# Patient Record
Sex: Male | Born: 1975 | Race: White | Hispanic: No | Marital: Married | Smoking: Former smoker
Health system: Southern US, Community
[De-identification: ages and names within clinical notes are randomized; demographics above are authoritative.]

## PROBLEM LIST (undated history)

## (undated) DIAGNOSIS — M25562 Pain in left knee: Secondary | ICD-10-CM

## (undated) DIAGNOSIS — F1110 Opioid abuse, uncomplicated: Secondary | ICD-10-CM

## (undated) DIAGNOSIS — F32A Depression, unspecified: Secondary | ICD-10-CM

## (undated) DIAGNOSIS — F329 Major depressive disorder, single episode, unspecified: Secondary | ICD-10-CM

---

## 2002-01-09 ENCOUNTER — Emergency Department (HOSPITAL_COMMUNITY): Admission: EM | Admit: 2002-01-09 | Discharge: 2002-01-09 | Payer: Self-pay | Admitting: Emergency Medicine

## 2002-07-22 ENCOUNTER — Emergency Department (HOSPITAL_COMMUNITY): Admission: EM | Admit: 2002-07-22 | Discharge: 2002-07-22 | Payer: Self-pay | Admitting: Emergency Medicine

## 2004-03-03 ENCOUNTER — Emergency Department (HOSPITAL_COMMUNITY): Admission: EM | Admit: 2004-03-03 | Discharge: 2004-03-03 | Payer: Self-pay | Admitting: Family Medicine

## 2004-05-05 ENCOUNTER — Emergency Department (HOSPITAL_COMMUNITY): Admission: EM | Admit: 2004-05-05 | Discharge: 2004-05-05 | Payer: Self-pay | Admitting: Family Medicine

## 2006-12-25 ENCOUNTER — Emergency Department (HOSPITAL_COMMUNITY): Admission: EM | Admit: 2006-12-25 | Discharge: 2006-12-25 | Payer: Self-pay | Admitting: Family Medicine

## 2007-01-11 ENCOUNTER — Ambulatory Visit: Payer: Self-pay | Admitting: Family Medicine

## 2007-02-23 ENCOUNTER — Ambulatory Visit: Payer: Self-pay | Admitting: Family Medicine

## 2007-02-23 DIAGNOSIS — E663 Overweight: Secondary | ICD-10-CM | POA: Insufficient documentation

## 2007-03-21 ENCOUNTER — Ambulatory Visit: Payer: Self-pay | Admitting: Family Medicine

## 2007-03-21 ENCOUNTER — Encounter: Payer: Self-pay | Admitting: Family Medicine

## 2007-03-21 LAB — CONVERTED CEMR LAB
ALT: 21 units/L (ref 0–53)
AST: 15 units/L (ref 0–37)
Albumin: 4.5 g/dL (ref 3.5–5.2)
Alkaline Phosphatase: 57 units/L (ref 39–117)
BUN: 9 mg/dL (ref 6–23)
CO2: 22 meq/L (ref 19–32)
Calcium: 9 mg/dL (ref 8.4–10.5)
Chloride: 107 meq/L (ref 96–112)
Cholesterol: 136 mg/dL (ref 0–200)
Creatinine, Ser: 0.84 mg/dL (ref 0.40–1.50)
Glucose, Bld: 97 mg/dL (ref 70–99)
HCT: 45.6 % (ref 39.0–52.0)
HDL: 35 mg/dL — ABNORMAL LOW (ref >39)
Hemoglobin: 15 g/dL (ref 13.0–17.0)
LDL Cholesterol: 81 mg/dL (ref 0–99)
MCHC: 32.9 g/dL (ref 30.0–36.0)
MCV: 89.4 fL (ref 78.0–100.0)
Platelets: 166 10*3/uL (ref 150–400)
Potassium: 3.7 meq/L (ref 3.5–5.3)
RBC: 5.1 M/uL (ref 4.22–5.81)
RDW: 12.9 % (ref 11.5–15.5)
Rapid Strep: NEGATIVE
Sodium: 140 meq/L (ref 135–145)
Total Bilirubin: 0.7 mg/dL (ref 0.3–1.2)
Total CHOL/HDL Ratio: 3.9
Total Protein: 6.3 g/dL (ref 6.0–8.3)
Triglycerides: 102 mg/dL (ref <150)
VLDL: 20 mg/dL (ref 0–40)
WBC: 5.3 10*3/uL (ref 4.0–10.5)

## 2007-08-10 ENCOUNTER — Telehealth (INDEPENDENT_AMBULATORY_CARE_PROVIDER_SITE_OTHER): Payer: Self-pay | Admitting: Family Medicine

## 2007-08-10 ENCOUNTER — Ambulatory Visit: Payer: Self-pay | Admitting: Family Medicine

## 2007-08-10 ENCOUNTER — Encounter: Admission: RE | Admit: 2007-08-10 | Discharge: 2007-08-10 | Payer: Self-pay | Admitting: Family Medicine

## 2007-08-10 DIAGNOSIS — M25569 Pain in unspecified knee: Secondary | ICD-10-CM

## 2007-11-09 ENCOUNTER — Ambulatory Visit: Payer: Self-pay | Admitting: Family Medicine

## 2007-12-20 ENCOUNTER — Telehealth: Payer: Self-pay | Admitting: Family Medicine

## 2008-03-04 ENCOUNTER — Ambulatory Visit: Payer: Self-pay | Admitting: Family Medicine

## 2008-04-16 ENCOUNTER — Encounter: Payer: Self-pay | Admitting: Family Medicine

## 2009-05-04 ENCOUNTER — Emergency Department (HOSPITAL_COMMUNITY): Admission: EM | Admit: 2009-05-04 | Discharge: 2009-05-04 | Payer: Self-pay | Admitting: Family Medicine

## 2009-09-11 ENCOUNTER — Ambulatory Visit: Payer: Self-pay | Admitting: Family Medicine

## 2010-02-07 ENCOUNTER — Emergency Department (HOSPITAL_COMMUNITY)
Admission: EM | Admit: 2010-02-07 | Discharge: 2010-02-07 | Payer: Self-pay | Source: Home / Self Care | Admitting: Family Medicine

## 2010-02-15 LAB — POCT URINALYSIS DIPSTICK
Hgb urine dipstick: NEGATIVE
Ketones, ur: 15 mg/dL — AB
Nitrite: NEGATIVE
Protein, ur: 100 mg/dL — AB
Specific Gravity, Urine: 1.025 (ref 1.005–1.030)
Urine Glucose, Fasting: NEGATIVE mg/dL
Urobilinogen, UA: 1 mg/dL (ref 0.0–1.0)
pH: 6.5 (ref 5.0–8.0)

## 2010-03-02 NOTE — Assessment & Plan Note (Signed)
Summary: cpe/kh   Vital Signs:  Patient profile:   35 year old male Height:      66 inches Weight:      161.6 pounds BMI:     26.18 Temp:     97.7 degrees F oral Pulse rate:   139 / minute BP sitting:   116 / 75  (left arm) Cuff size:   regular  Vitals Entered By: Garen Grams LPN (September 11, 2009 10:52 AM) CC: cpe Is Patient Diabetic? No Pain Assessment Patient in pain? no        Primary Care Webber Michiels:  Renold Don MD  CC:  cpe.  History of Present Illness: Patient here for complete physical exam.  States he has been healthy, no recent illnesses.  No significant past medical history or surgical history.  Only complaint is chronic LEFT knee pain.  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  Managed with Percocet, only course of action per patient is surgery which he wants to put off as long as possible due to cost.    ROS:  no headaches, pre-syncopal or syncopal episodes, chest pain, palpitations, shortness of breath or dyspnea, abdominal pain, diarrhea or constipation, melena, hematochezia, lower extremity swelling.    Habits & Providers  Alcohol-Tobacco-Diet     Tobacco Status: never  Current Problems (verified): 1)  Need Prophylactic Vaccination&inoculation Flu  (ICD-V04.81) 2)  Knee Pain, Left  (ICD-719.46) 3)  Well Adult Exam  (ICD-V70.0) 4)  Overweight  (ICD-278.02)  Current Medications (verified): 1)  Ultram 50 Mg  Tabs (Tramadol Hcl) .... Take One Tablet Every 6 Hours As Needed Pain 2)  Tylenol Extra Strength 500 Mg  Tabs (Acetaminophen) .... Take Two Tablets By Mouth Every 8 Hours As Needed For Pain 3)  Maxifed-G 40-400 Mg  Tabs (Pseudoephedrine-Guaifenesin) .Marland Kitchen.. 1 Tablet Every 6 Hours As Needed. 4)  Claritin 10 Mg  Tabs (Loratadine) .Marland Kitchen.. 1 Tablet A Day As Antihistamine 5)  Voltaren 75 Mg  Tbec (Diclofenac Sodium) .Marland Kitchen.. 1 By Mouth Two Times A Day For 2 Weeks Then Prn 6)  Capsaicin Hp 0.1 %  Crea  (Capsaicin) .... Aaa Three Times A Day As Needed Pain 7)  Flonase 50 Mcg/act Susp (Fluticasone Propionate) .... 2 Sprays in Each Nostril Daily  Allergies (verified): No Known Drug Allergies  Past History:  Past medical, surgical, family and social histories (including risk factors) reviewed, and no changes noted (except as noted below).  Past Medical History: Reviewed history from 03/21/2007 and no changes required. 1.  Arthritis 2.     Past Surgical History: Reviewed history from 03/21/2007 and no changes required. None    Family History: Reviewed history from 03/21/2007 and no changes required. 1. Dad- died MI at 67yo, HTN     Social History: Reviewed history from 03/21/2007 and no changes required. He lives with his girlfriend (Amy [place]), 3 sons Jill Alexanders, Elma [place], [place]) and stepdaughter Leonides Schanz).  Works with Social worker.  No tobacco, 1 beer/month, no drugs.  Physically active with work and at home.    Smoking Status:  never  Physical Exam  General:  Vital signs reviewed. Well-developed, well-nourished patient in NAD.  Awake and cooperative  Head:  normocephalic and atraumatic.   Eyes:  vision grossly intact, pupils equal, pupils round, and pupils reactive to light.   Ears:  External ear exam shows no significant lesions or deformities.  Otoscopic examination reveals clear canals, tympanic membranes are intact bilaterally without bulging, retraction,  inflammation or discharge. Hearing is grossly normal bilaterally. Mouth:  Oral mucosa and oropharynx without lesions or exudates.  Teeth in good repair. Neck:  supple, full ROM, and no masses.   Lungs:  clear to auscultation bilaterally without wheezing, rales, or rhonchi.  Normal work of breathing  Heart:  Regular rate and rhythm without murmur, rub, or gallop.  Normal S1/S2  Abdomen:  soft/nontender/nondistended.  No organomegaly, bowel sounds present.  Msk:  No deformity or scoliosis noted of thoracic or lumbar  spine.   Pulses:  R and L carotid,radial,femoral,dorsalis pedis and posterior tibial pulses are full and equal bilaterally Extremities:  No clubbing, cyanosis, edema, or deformity.  No decrease range of motion noted in any joints.  No point tenderness over LEFT knee.  No swelling noted currently. Neurologic:  No focal deficits Skin:  No suspicious lesions or moles.  Patient does have freckles across neck, forehead, and lower arms, most likely from sun exposure. Psych:  normally interactive, good eye contact, not anxious appearing, and not depressed appearing.     Impression & Recommendations:  Problem # 1:  WELL ADULT EXAM (ICD-V70.0) Assessment Unchanged Patient doing well.  No HTN, DM, never smoked, last lipid check was WNL.  Concern for father with first MI at age 66.  Risk stratification for this patient is low as family history is only concern for CAD.  Otherwise vital signs and physical exam show nothing worrisome.  Plan to follow up as needed.   Orders: [facility] - Est  18-39 yrs (14782)  Problem # 2:  KNEE PAIN, LEFT (ICD-719.46) Assessment: Unchanged Patient prescribed Percocet from orthopedist.  Will let Orthopedist manage this medication.  Currently states knee not bothering him much, flares up only when he has to cover great distance at work, he works outside and puts [employer] so his job requires much walking.   His updated medication list for this problem includes:    Ultram 50 Mg Tabs (Tramadol hcl) .Marland Kitchen... Take one tablet every 6 hours as needed pain    Tylenol Extra Strength 500 Mg Tabs (Acetaminophen) .Marland Kitchen... Take two tablets by mouth every 8 hours as needed for pain    Voltaren 75 Mg Tbec (Diclofenac sodium) .Marland Kitchen... 1 by mouth two times a day for 2 weeks then prn  Complete Medication List: 1)  Ultram 50 Mg Tabs (Tramadol hcl) .... Take one tablet every 6 hours as needed pain 2)  Tylenol Extra Strength 500 Mg Tabs (Acetaminophen) .... Take two tablets by mouth every 8 hours as needed  for pain 3)  Maxifed-g 40-400 Mg Tabs (Pseudoephedrine-guaifenesin) .Marland Kitchen.. 1 tablet every 6 hours as needed. 4)  Claritin 10 Mg Tabs (Loratadine) .Marland Kitchen.. 1 tablet a day as antihistamine 5)  Voltaren 75 Mg Tbec (Diclofenac sodium) .Marland Kitchen.. 1 by mouth two times a day for 2 weeks then prn 6)  Capsaicin Hp 0.1 % Crea (Capsaicin) .... Aaa three times a day as needed pain 7)  Flonase 50 Mcg/act Susp (Fluticasone propionate) .... 2 sprays in each nostril daily

## 2010-11-09 LAB — INFLUENZA A AND B ANTIGEN (CONVERTED LAB)
Inflenza A Ag: NEGATIVE
Influenza B Ag: NEGATIVE

## 2011-02-04 ENCOUNTER — Emergency Department (HOSPITAL_COMMUNITY)
Admission: EM | Admit: 2011-02-04 | Discharge: 2011-02-05 | Disposition: A | Discharge status: Home / Self Care | Payer: Self-pay | Attending: Emergency Medicine | Admitting: Emergency Medicine

## 2011-02-04 ENCOUNTER — Encounter: Payer: Self-pay | Admitting: Emergency Medicine

## 2011-02-04 DIAGNOSIS — K029 Dental caries, unspecified: Secondary | ICD-10-CM | POA: Insufficient documentation

## 2011-02-04 DIAGNOSIS — Z79899 Other long term (current) drug therapy: Secondary | ICD-10-CM | POA: Insufficient documentation

## 2011-02-04 DIAGNOSIS — K089 Disorder of teeth and supporting structures, unspecified: Secondary | ICD-10-CM | POA: Insufficient documentation

## 2011-02-04 NOTE — ED Notes (Signed)
PT. REPORTS RIGHT LOWER MOLAR PAIN FOR SEVERAL DAYS , UNRELIEVED BY OTC PAIN MEDICATIONS

## 2011-02-05 MED ORDER — HYDROCODONE-ACETAMINOPHEN 7.5-500 MG/15ML PO SOLN: 15.0000 mL | Freq: Four times a day (QID) | ORAL | Status: AC | PRN

## 2011-02-05 MED ORDER — TRAMADOL HCL 50 MG PO TABS
50.0000 mg | ORAL_TABLET | Freq: Once | ORAL | Status: AC
  Administered 2011-02-05: 50 mg via ORAL
  Filled 2011-02-05: qty 1

## 2011-02-05 MED ORDER — PENICILLIN V POTASSIUM 500 MG PO TABS: 500.0000 mg | ORAL_TABLET | Freq: Four times a day (QID) | ORAL | Status: AC

## 2011-02-05 MED ORDER — PENICILLIN V POTASSIUM 250 MG PO TABS
500.0000 mg | ORAL_TABLET | Freq: Once | ORAL | Status: AC
  Administered 2011-02-05: 500 mg via ORAL
  Filled 2011-02-05: qty 2

## 2011-02-05 NOTE — ED Provider Notes (Signed)
History     CSN: [id number]  Arrival date & time 02/04/11  2302   First MD Initiated Contact with Patient 02/05/11 [phone]      Chief Complaint  Patient presents with  . Dental Pain    (Consider location/radiation/quality/duration/timing/severity/associated sxs/prior treatment) Patient is a 36 y.o. male presenting with tooth pain. The history is provided by the patient. No language interpreter was used.  Dental PainPrimary symptoms do not include mouth pain, oral bleeding, oral lesions, headaches, fever, sore throat, angioedema or cough. The symptoms began 3 to 5 days ago. The symptoms are worsening. The symptoms are recurrent. The symptoms occur constantly.  Additional symptoms include: dental sensitivity to temperature. Additional symptoms do not include: gum swelling, purulent gums, trismus, facial swelling, trouble swallowing and pain with swallowing. Medical issues do not include: smoking.    History reviewed. No pertinent past medical history.  History reviewed. No pertinent past surgical history.  No family history on file.  History  Substance Use Topics  . Smoking status: Never Smoker   . Smokeless tobacco: Not on file  . Alcohol Use: No      Review of Systems  Constitutional: Negative for fever.  HENT: Negative for sore throat, facial swelling, trouble swallowing and neck pain.   Eyes: Negative.   Respiratory: Negative for cough.   Cardiovascular: Negative.   Gastrointestinal: Negative.   Genitourinary: Negative.   Musculoskeletal: Negative.   Neurological: Negative for headaches.  Hematological: Negative.   Psychiatric/Behavioral: Negative.     Allergies  Ibuprofen  Home Medications   Current Outpatient Rx  Name Route Sig Dispense Refill  . ACETAMINOPHEN 500 MG PO TABS Oral Take 500 mg by mouth every 6 (six) hours as needed. For pain.     Marland Kitchen BENZOCAINE 7.5 % MT GEL Mouth/Throat Use as directed 1 application in the mouth or throat 3 (three) times daily as  needed. For tooth pain.     Marland Kitchen HYDROCODONE-ACETAMINOPHEN 7.5-500 MG/15ML PO SOLN Oral Take 15 mLs by mouth every 6 (six) hours as needed for pain. 120 mL 0  . PENICILLIN V POTASSIUM 500 MG PO TABS Oral Take 1 tablet (500 mg total) by mouth 4 (four) times daily. 28 tablet 0    BP 122/77  Pulse 60  Temp(Src) 98.3 F (36.8 C) (Oral)  Resp 18  SpO2 100%  Physical Exam  Constitutional: He is oriented to person, place, and time. He appears well-developed and well-nourished. No distress.  HENT:  Head: Normocephalic and atraumatic.  Mouth/Throat:    Eyes: Pupils are equal, round, and reactive to light.  Neck: Normal range of motion.  Cardiovascular: Normal rate and regular rhythm.   Pulmonary/Chest: Effort normal and breath sounds normal.  Abdominal: Soft. Bowel sounds are normal.  Musculoskeletal: Normal range of motion.  Lymphadenopathy:    He has no cervical adenopathy.  Neurological: He is alert and oriented to person, place, and time.  Skin: Skin is warm and dry.  Psychiatric: Thought content normal.    ED Course  Procedures (including critical care time)  Labs Reviewed - No data to display No results found.   1. Dental caries       MDM  Follow up with dentistry take all antibiotics       Constancia Geeting K Jorryn Casagrande-Rasch, MD 02/05/11 [phone]

## 2011-08-09 ENCOUNTER — Emergency Department (HOSPITAL_BASED_OUTPATIENT_CLINIC_OR_DEPARTMENT_OTHER)
Admission: EM | Admit: 2011-08-09 | Discharge: 2011-08-09 | Disposition: A | Discharge status: Home / Self Care | Payer: Self-pay | Attending: Emergency Medicine | Admitting: Emergency Medicine

## 2011-08-09 ENCOUNTER — Encounter (HOSPITAL_BASED_OUTPATIENT_CLINIC_OR_DEPARTMENT_OTHER): Payer: Self-pay | Admitting: Emergency Medicine

## 2011-08-09 DIAGNOSIS — Z888 Allergy status to other drugs, medicaments and biological substances status: Secondary | ICD-10-CM | POA: Insufficient documentation

## 2011-08-09 DIAGNOSIS — K0889 Other specified disorders of teeth and supporting structures: Secondary | ICD-10-CM

## 2011-08-09 DIAGNOSIS — K089 Disorder of teeth and supporting structures, unspecified: Secondary | ICD-10-CM | POA: Insufficient documentation

## 2011-08-09 MED ORDER — TRAMADOL HCL 50 MG PO TABS: 50.0000 mg | ORAL_TABLET | Freq: Four times a day (QID) | ORAL | Status: AC | PRN

## 2011-08-09 MED ORDER — PENICILLIN V POTASSIUM 250 MG PO TABS: 250.0000 mg | ORAL_TABLET | Freq: Four times a day (QID) | ORAL | Status: AC

## 2011-08-09 MED ORDER — PENICILLIN V POTASSIUM 250 MG PO TABS
ORAL_TABLET | ORAL | Status: AC
  Administered 2011-08-09: 250 mg
  Filled 2011-08-09: qty 1

## 2011-08-09 MED ORDER — TRAMADOL HCL 50 MG PO TABS
ORAL_TABLET | ORAL | Status: AC
  Administered 2011-08-09: 50 mg
  Filled 2011-08-09: qty 1

## 2011-08-09 NOTE — ED Notes (Signed)
C/o left lower jaw behind wisdom tooth.

## 2011-08-09 NOTE — ED Provider Notes (Signed)
History     CSN: [id number]  Arrival date & time 08/09/11  1650   First MD Initiated Contact with Patient 08/09/11 1714      Chief Complaint  Patient presents with  . Dental Pain    (Consider location/radiation/quality/duration/timing/severity/associated sxs/prior treatment) HPI Comments: C/o left lower dental pain:pt states that he has a history:pt is in drug rehab  Patient is a 36 y.o. male presenting with tooth pain. The history is provided by the patient. No language interpreter was used.  Dental PainThe primary symptoms include mouth pain. The symptoms began 3 to 5 days ago. The symptoms are improving. The symptoms are new. The symptoms occur constantly.  Additional symptoms do not include: gum swelling.    History reviewed. No pertinent past medical history.  History reviewed. No pertinent past surgical history.  No family history on file.  History  Substance Use Topics  . Smoking status: Never Smoker   . Smokeless tobacco: Not on file  . Alcohol Use: No      Review of Systems  Constitutional: Negative.   Respiratory: Negative.   Cardiovascular: Negative.     Allergies  Ibuprofen  Home Medications   Current Outpatient Rx  Name Route Sig Dispense Refill  . PENICILLIN V POTASSIUM 250 MG PO TABS Oral Take 1 tablet (250 mg total) by mouth 4 (four) times daily. 28 tablet 0  . TRAMADOL HCL 50 MG PO TABS Oral Take 1 tablet (50 mg total) by mouth every 6 (six) hours as needed for pain. 15 tablet 0    BP 116/70  Pulse 79  Temp 98 F (36.7 C) (Oral)  Resp 16  Ht 5\' 7"  (1.702 m)  Wt 135 lb (61.236 kg)  BMI 21.14 kg/m2  SpO2 100%  Physical Exam  Nursing note and vitals reviewed. Constitutional: He appears well-developed and well-nourished.  HENT:  Right Ear: External ear normal.  Left Ear: External ear normal.       Mild generalized decay noted to dentition  Eyes: Conjunctivae and EOM are normal.  Cardiovascular: Normal rate and regular rhythm.     Pulmonary/Chest: Effort normal and breath sounds normal.    ED Course  Procedures (including critical care time)  Labs Reviewed - No data to display No results found.   1. Toothache       MDM  Pt treated:no narcotics to be given        Teressa Lower, NP 08/09/11 1804

## 2011-08-10 NOTE — ED Provider Notes (Signed)
Medical screening examination/treatment/procedure(s) were performed by non-physician practitioner and as supervising physician I was immediately available for consultation/collaboration.   Charles B. Sheldon, MD 08/10/11 0947 

## 2011-08-25 ENCOUNTER — Emergency Department (HOSPITAL_BASED_OUTPATIENT_CLINIC_OR_DEPARTMENT_OTHER)
Admission: EM | Admit: 2011-08-25 | Discharge: 2011-08-25 | Disposition: A | Discharge status: Home / Self Care | Payer: Self-pay | Attending: Emergency Medicine | Admitting: Emergency Medicine

## 2011-08-25 ENCOUNTER — Encounter (HOSPITAL_BASED_OUTPATIENT_CLINIC_OR_DEPARTMENT_OTHER): Payer: Self-pay

## 2011-08-25 DIAGNOSIS — K029 Dental caries, unspecified: Secondary | ICD-10-CM | POA: Insufficient documentation

## 2011-08-25 DIAGNOSIS — K0889 Other specified disorders of teeth and supporting structures: Secondary | ICD-10-CM

## 2011-08-25 MED ORDER — ERYTHROMYCIN BASE 333 MG PO TBEC: 333.0000 mg | DELAYED_RELEASE_TABLET | Freq: Three times a day (TID) | ORAL | Status: AC

## 2011-08-25 MED ORDER — TRAMADOL HCL 50 MG PO TABS: 50.0000 mg | ORAL_TABLET | Freq: Four times a day (QID) | ORAL | Status: AC | PRN

## 2011-08-25 MED ORDER — TRAMADOL HCL 50 MG PO TABS
ORAL_TABLET | ORAL | Status: AC
  Administered 2011-08-25: 50 mg
  Filled 2011-08-25: qty 1

## 2011-08-25 NOTE — ED Provider Notes (Signed)
History     CSN: [id number]  Arrival date & time 08/25/11  4401   First MD Initiated Contact with Patient 08/25/11 1003      Chief Complaint  Patient presents with  . 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  Has had toothache while there.  Was seen here two weeks ago and given tramadol and penicillin.  He finished the penicillin and now pain is worsening.  Patient is a 36 y.o. male presenting with tooth pain. The history is provided by the patient.  Dental PainThe primary symptoms include mouth pain. The symptoms began more than 1 week ago. The symptoms are worsening. The symptoms are recurrent. The symptoms occur constantly.  Additional symptoms include: gum swelling and gum tenderness.    History reviewed. No pertinent past medical history.  History reviewed. No pertinent past surgical history.  No family history on file.  History  Substance Use Topics  . Smoking status: Never Smoker   . Smokeless tobacco: Not on file  . Alcohol Use: No      Review of Systems  All other systems reviewed and are negative.    Allergies  Ibuprofen  Home Medications  No current outpatient prescriptions on file.  BP 134/85  Pulse 69  Temp 98.7 F (37.1 C) (Oral)  Resp 16  SpO2 100%  Physical Exam  Nursing note and vitals reviewed. Constitutional: He is oriented to person, place, and time. He appears well-developed and well-nourished. No distress.  HENT:  Head: Normocephalic.       There is poor dentition with caries throughout.  The left 2nd molar is noted to have an area of decay, erythema to the gums, and is ttp.  Neck: Normal range of motion. Neck supple.  Pulmonary/Chest: Effort normal.  Lymphadenopathy:    He has no cervical adenopathy.  Neurological: He is alert and oriented to person, place, and time.  Skin: Skin is warm and dry. He  is not diaphoretic.    ED Course  Procedures (including critical care time)  Labs Reviewed - No data to display No results found.   No diagnosis found.    MDM  Will prescribe tramadol and antibiotics.  He needs to see a dentist and will be given the resource guide.        Geoffery Lyons, MD 08/25/11 1011

## 2011-08-25 NOTE — ED Notes (Signed)
Dental pain x 1 week.  Seen in ED for same last week.

## 2011-11-03 ENCOUNTER — Emergency Department (HOSPITAL_COMMUNITY)
Admission: EM | Admit: 2011-11-03 | Discharge: 2011-11-05 | Disposition: A | Discharge status: Rehabilitation Facility | Payer: Self-pay | Attending: Emergency Medicine | Admitting: Emergency Medicine

## 2011-11-03 ENCOUNTER — Encounter (HOSPITAL_COMMUNITY): Payer: Self-pay | Admitting: Emergency Medicine

## 2011-11-03 DIAGNOSIS — M549 Dorsalgia, unspecified: Secondary | ICD-10-CM | POA: Insufficient documentation

## 2011-11-03 DIAGNOSIS — F1110 Opioid abuse, uncomplicated: Secondary | ICD-10-CM | POA: Insufficient documentation

## 2011-11-03 DIAGNOSIS — R197 Diarrhea, unspecified: Secondary | ICD-10-CM | POA: Insufficient documentation

## 2011-11-03 LAB — ACETAMINOPHEN LEVEL: Acetaminophen (Tylenol), Serum: <15 ug/mL (ref 10–30)

## 2011-11-03 LAB — RAPID URINE DRUG SCREEN, HOSP PERFORMED
Amphetamines: NOT DETECTED
Barbiturates: NOT DETECTED
Benzodiazepines: NOT DETECTED
Cocaine: NOT DETECTED
Opiates: POSITIVE — AB
Tetrahydrocannabinol: NOT DETECTED

## 2011-11-03 LAB — CBC WITH DIFFERENTIAL/PLATELET
Basophils Absolute: 0.1 10*3/uL (ref 0.0–0.1)
Basophils Relative: 1 % (ref 0–1)
Eosinophils Absolute: 0.4 10*3/uL (ref 0.0–0.7)
Eosinophils Relative: 7 % — ABNORMAL HIGH (ref 0–5)
HCT: 38.4 % — ABNORMAL LOW (ref 39.0–52.0)
Hemoglobin: 12.9 g/dL — ABNORMAL LOW (ref 13.0–17.0)
Lymphocytes Relative: 25 % (ref 12–46)
Lymphs Abs: 1.5 10*3/uL (ref 0.7–4.0)
MCH: 30.4 pg (ref 26.0–34.0)
MCHC: 33.6 g/dL (ref 30.0–36.0)
MCV: 90.6 fL (ref 78.0–100.0)
Monocytes Absolute: 0.4 10*3/uL (ref 0.1–1.0)
Monocytes Relative: 7 % (ref 3–12)
Neutro Abs: 3.7 10*3/uL (ref 1.7–7.7)
Neutrophils Relative %: 61 % (ref 43–77)
Platelets: 159 10*3/uL (ref 150–400)
RBC: 4.24 MIL/uL (ref 4.22–5.81)
RDW: 12.3 % (ref 11.5–15.5)
WBC: 6 10*3/uL (ref 4.0–10.5)

## 2011-11-03 LAB — COMPREHENSIVE METABOLIC PANEL
ALT: 11 U/L (ref 0–53)
AST: 15 U/L (ref 0–37)
Albumin: 3.9 g/dL (ref 3.5–5.2)
Alkaline Phosphatase: 73 U/L (ref 39–117)
BUN: 13 mg/dL (ref 6–23)
CO2: 29 mEq/L (ref 19–32)
Calcium: 9.2 mg/dL (ref 8.4–10.5)
Chloride: 102 mEq/L (ref 96–112)
Creatinine, Ser: 0.92 mg/dL (ref 0.50–1.35)
GFR calc Af Amer: >90 mL/min (ref >90)
GFR calc non Af Amer: >90 mL/min (ref >90)
Glucose, Bld: 69 mg/dL — ABNORMAL LOW (ref 70–99)
Potassium: 3.5 mEq/L (ref 3.5–5.1)
Sodium: 140 mEq/L (ref 135–145)
Total Bilirubin: 0.2 mg/dL — ABNORMAL LOW (ref 0.3–1.2)
Total Protein: 6.7 g/dL (ref 6.0–8.3)

## 2011-11-03 LAB — SALICYLATE LEVEL: Salicylate Lvl: <2 mg/dL — ABNORMAL LOW (ref 2.8–20.0)

## 2011-11-03 LAB — ETHANOL: Alcohol, Ethyl (B): <11 mg/dL (ref 0–11)

## 2011-11-03 MED ORDER — ACETAMINOPHEN 325 MG PO TABS
650.0000 mg | ORAL_TABLET | Freq: Four times a day (QID) | ORAL | Status: DC | PRN
  Administered 2011-11-03 – 2011-11-04 (×2): 650 mg via ORAL
  Filled 2011-11-03 (×2): qty 2

## 2011-11-03 NOTE — ED Provider Notes (Signed)
Medical screening examination/treatment/procedure(s) were performed by non-physician practitioner and as supervising physician I was immediately available for consultation/collaboration.   Richardean Canal, MD 11/03/11 [phone]

## 2011-11-03 NOTE — ED Notes (Signed)
Patient reports that he wants detox from heroine use.  Last use yesterday -- patient unsure how much he did yesterday.  Patient reports that he has ben using heroine for the past two months; daily usage.  Patient is enrolled in a rehab facility at [facility], but cannot get in until the 17th of this month (patient went by today to be put on the waiting list).  Patient reports the following symptoms: diarrhea, lower back pain, fatigue, chills, and loss of appetite.

## 2011-11-03 NOTE — ED Provider Notes (Signed)
History     CSN: [id number]  Arrival date & time 11/03/11  2004   First MD Initiated Contact with Patient 11/03/11 2020      No chief complaint on file.   (Consider location/radiation/quality/duration/timing/severity/associated sxs/prior treatment) HPI  36 y.o. male INAD presenting for detox from opioids. Patient denies any other illicit drug use or alcohol abuse. Pt has been snorting heroin and taking Roxicet daily off and on for the last 10 years worsening significantly over the last 2 months. He detoxed at [facility] in June, but began using again. Patient has contacted [facility] and is on the waiting list he will be admitted on the 17th. Denies nausea, agitation, suicidal ideation or hallucinations. Patient does have diarrhea low back pain fatigue and loss of appetite.   History reviewed. No pertinent past medical history.  History reviewed. No pertinent past surgical history.  History reviewed. No pertinent family history.  History  Substance Use Topics  . Smoking status: Current Some Day Smoker  . Smokeless tobacco: Not on file  . Alcohol Use: No      Review of Systems  Constitutional: Negative for fever.  Respiratory: Negative for shortness of breath.   Cardiovascular: Negative for chest pain.  Gastrointestinal: Positive for diarrhea. Negative for nausea, vomiting and abdominal pain.  Musculoskeletal: Positive for back pain.  All other systems reviewed and are negative.    Allergies  Ibuprofen  Home Medications  No current outpatient prescriptions on file.  BP 123/66  Pulse 57  Temp 98.7 F (37.1 C) (Oral)  Resp 18  SpO2 100%  Physical Exam  Nursing note and vitals reviewed. Constitutional: He is oriented to person, place, and time. He appears well-developed and well-nourished. No distress.  HENT:  Head: Normocephalic.  Eyes: Conjunctivae normal and EOM are normal. Pupils are equal, round, and reactive to light.  Cardiovascular: Normal rate, regular  rhythm and normal heart sounds.   Pulmonary/Chest: Effort normal and breath sounds normal. No stridor.  Abdominal: Soft. Bowel sounds are normal. He exhibits no distension and no mass. There is no tenderness. There is no rebound.  Musculoskeletal: Normal range of motion.  Neurological: He is alert and oriented to person, place, and time.  Psychiatric: He has a normal mood and affect.    ED Course  Procedures (including critical care time)  Labs Reviewed  CBC WITH DIFFERENTIAL - Abnormal; Notable for the following:    Hemoglobin 12.9 (*)     HCT 38.4 (*)     Eosinophils Relative 7 (*)     All other components within normal limits  COMPREHENSIVE METABOLIC PANEL - Abnormal; Notable for the following:    Glucose, Bld 69 (*)     Total Bilirubin 0.2 (*)     All other components within normal limits  SALICYLATE LEVEL - Abnormal; Notable for the following:    Salicylate Lvl <2.0 (*)     All other components within normal limits  URINE RAPID DRUG SCREEN (HOSP PERFORMED) - Abnormal; Notable for the following:    Opiates POSITIVE (*)     All other components within normal limits  ETHANOL  ACETAMINOPHEN LEVEL   No results found.   1. 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       William Emery, PA-C 11/03/11 2224

## 2011-11-03 NOTE — ED Notes (Signed)
Pt has been wanded by security. 

## 2011-11-04 MED ORDER — CLONIDINE HCL 0.1 MG PO TABS
0.1000 mg | ORAL_TABLET | Freq: Two times a day (BID) | ORAL | Status: DC | PRN
  Administered 2011-11-04 (×2): 0.1 mg via ORAL
  Filled 2011-11-04 (×2): qty 1

## 2011-11-04 NOTE — ED Notes (Signed)
Patient sitting up in bed eating dinner with NAD. Patient denies any discomfort and denies any needs.

## 2011-11-04 NOTE — ED Notes (Addendum)
Patient's family visiting patient. Patient continues to be unable to eat and denies stomach discomfort or nausea.

## 2011-11-04 NOTE — ED Notes (Signed)
Pt slept on and off.Awaiting telepsych.Seen by ACT team overnight.

## 2011-11-04 NOTE — ED Notes (Signed)
Pts mother and children visiting

## 2011-11-04 NOTE — BH Assessment (Signed)
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 Patient reports that his last use yesterday -- patient unsure how much he did yesterday. Patient reports an increase in his usage of heroine for the past two months.  Patient reports that he has been using daily.  Patient has been snorting heroin and taking Roxicet daily off and on for the last 10 years.  Patient reported that he detoxed at [facility] in June, but began using again.  Patient reports that he is enrolled in a rehab facility at [facility], but cannot get in until the 17th of this month (patient went by today to be put on the waiting list). Patient reports the following withdrawal symptoms: diarrhea, lower back pain, fatigue, chills, and loss of appetite.  Patient UDS tested positive for Opiates.  Patient BAL was <11. Patient denied any SI/HI.  Patient denied any psychosis.  Patient denied any prior psychiatric hospitalizations.  Patient denied any prior mental health counseling    Axis I: Substance Abuse and Depressive Disorder  Axis II: Deferred Axis III: History reviewed. No pertinent past medical history. Axis IV: economic problems, occupational problems, other psychosocial or environmental problems, problems related to social environment and problems with primary support group Axis V: 31-40 impairment in reality testing  Past Medical History: History reviewed. No pertinent past medical history.  History reviewed. No pertinent past surgical history.  Family History: History reviewed. No pertinent family history.  Social History:  reports that he has been smoking.  He does not have any smokeless tobacco history on file. He reports that he uses illicit drugs. He reports that he does not drink alcohol.  Additional Social History:  Alcohol / Drug Use History of alcohol / drug use?: Yes Substance #1 Name of Substance 1: Heroin  1 - Age of First Use: 23 1 - Amount (size/oz): varies  1 - Frequency:  dailyl  1 - Duration: varies  1 - Last Use / Amount: 2 days ago d  CIWA: CIWA-Ar BP: 106/80 mmHg Pulse Rate: 60  COWS: Clinical Opiate Withdrawal Scale (COWS) Resting Pulse Rate: Pulse Rate 80 or below Sweating: No report of chills or flushing Restlessness: Able to sit still Pupil Size: Pupils possibly larger than normal for room light Bone or Joint Aches: Patient reports sever diffuse aching of joints/muscles Runny Nose or Tearing: Not present GI Upset: Vomiting or diarrhea Tremor: No tremor Yawning: No yawning Anxiety or Irritability: Patient reports increasing irritability or anxiousness Gooseflesh Skin: Skin is smooth COWS Total Score: 7   Allergies:  Allergies  Allergen Reactions  . Ibuprofen Swelling    Home Medications:  (Not in a hospital admission)  OB/GYN Status:  No LMP for male patient.  General Assessment Data Location of Assessment: [facility] ED ACT Assessment: Yes Living Arrangements: Other (Comment) Can pt return to current living arrangement?: Yes Admission Status: Voluntary Is patient capable of signing voluntary admission?: Yes Transfer from: Acute Hospital Referral Source: Self/Family/Friend  Education Status Is patient currently in school?: No  Risk to self Suicidal Ideation: No Suicidal Intent: No Is patient at risk for suicide?: No Suicidal Plan?: No Access to Means: No What has been your use of drugs/alcohol within the last 12 months?: Heroin Previous Attempts/Gestures: No How many times?: 0  Other Self Harm Risks: none Triggers for Past Attempts: Unpredictable Intentional Self Injurious Behavior: None Family Suicide History: No Recent stressful life event(s): Financial Problems Persecutory voices/beliefs?: No Depression: Yes Depression Symptoms: Isolating;Fatigue;Loss of  interest in usual pleasures;Feeling worthless/self pity Substance abuse history and/or treatment for substance abuse?: Yes Suicide prevention information given to  non-admitted patients: Not applicable  Risk to Others Homicidal Ideation: No Thoughts of Harm to Others: No Current Homicidal Intent: No Current Homicidal Plan: No Access to Homicidal Means: No Identified Victim: None History of harm to others?: No Assessment of Violence: None Noted Violent Behavior Description: none Does patient have access to weapons?: No Criminal Charges Pending?: No Does patient have a court date: No  Psychosis Hallucinations: None noted Delusions: None noted  Mental Status Report Appear/Hygiene: Disheveled Eye Contact: Fair Motor Activity: Freedom of movement Speech: Logical/coherent Level of Consciousness: Alert Mood: Depressed Affect: Depressed Anxiety Level: None Thought Processes: Coherent Judgement: Unimpaired Orientation: Person;Place;Time;Situation Obsessive Compulsive Thoughts/Behaviors: None  Cognitive Functioning Concentration: Decreased Memory: Recent Intact;Remote Intact IQ: Average Insight: Poor Impulse Control: Poor Appetite: Poor Weight Loss: 0  Weight Gain: 0  Sleep: No Change Total Hours of Sleep: 8  Vegetative Symptoms: None  ADLScreening [facility] Assessment Services) Patient's cognitive ability adequate to safely complete daily activities?: Yes Patient able to express need for assistance with ADLs?: Yes Independently performs ADLs?: Yes (appropriate for developmental age)  Abuse/Neglect [facility]) Physical Abuse: Denies Verbal Abuse: Denies Sexual Abuse: Denies  Prior Inpatient Therapy Prior Inpatient Therapy: Yes Prior Therapy Dates: 2013 Prior Therapy Facilty/Provider(s): [facility]  Reason for Treatment: substance abuse   Prior Outpatient Therapy Prior Outpatient Therapy: No Prior Therapy Dates: None  Prior Therapy Facilty/Provider(s): none Reason for Treatment: N/A  ADL Screening (condition at time of admission) Patient's cognitive ability adequate to safely complete daily activities?: Yes Patient able to express  need for assistance with ADLs?: Yes Independently performs ADLs?: Yes (appropriate for developmental age)       Abuse/Neglect Assessment (Assessment to be complete while patient is alone) Physical Abuse: Denies Verbal Abuse: Denies Sexual Abuse: Denies          Additional Information 1:1 In Past 12 Months?: No CIRT Risk: No Elopement Risk: No Does patient have medical clearance?: Yes     Disposition: Pending [facility].  Disposition Disposition of Patient: Referred to Patient referred to: Other (Comment)  On Site Evaluation by:   Reviewed with Physician:     Phillip Heal [place] 11/04/2011 5:04 AM

## 2011-11-04 NOTE — ED Notes (Signed)
Patient states he is feeling better and denies any stomach discomfort and nausea/vomiting. Patient is sleeping most of the time and is easily aroused.

## 2011-11-04 NOTE — ED Notes (Signed)
Patient sleeping with NAD. Sitter at bedside.  

## 2011-11-05 NOTE — ED Notes (Signed)
Patient sleeping with NAD at this time. 

## 2011-11-05 NOTE — ED Provider Notes (Signed)
BP 100/57  Pulse 50  Temp 97.4 F (36.3 C) (Oral)  Resp 16  SpO2 97% No new issues overnight. Accepted at [facility]. Awaiting transport  Loren Racer, MD 11/05/11 [phone]

## 2011-11-05 NOTE — BH Assessment (Signed)
[facility] Assessment Progress Note      Pt has been accepted to [facility] per [place].  [facility] will call the pod C nurses station in the morning to arrange pick up.

## 2011-12-20 ENCOUNTER — Encounter (HOSPITAL_BASED_OUTPATIENT_CLINIC_OR_DEPARTMENT_OTHER): Payer: Self-pay

## 2011-12-20 ENCOUNTER — Emergency Department (HOSPITAL_BASED_OUTPATIENT_CLINIC_OR_DEPARTMENT_OTHER)
Admission: EM | Admit: 2011-12-20 | Discharge: 2011-12-20 | Disposition: A | Discharge status: Home / Self Care | Payer: Self-pay | Attending: Emergency Medicine | Admitting: Emergency Medicine

## 2011-12-20 DIAGNOSIS — K0889 Other specified disorders of teeth and supporting structures: Secondary | ICD-10-CM

## 2011-12-20 DIAGNOSIS — F17200 Nicotine dependence, unspecified, uncomplicated: Secondary | ICD-10-CM | POA: Insufficient documentation

## 2011-12-20 DIAGNOSIS — F1410 Cocaine abuse, uncomplicated: Secondary | ICD-10-CM | POA: Insufficient documentation

## 2011-12-20 DIAGNOSIS — K089 Disorder of teeth and supporting structures, unspecified: Secondary | ICD-10-CM | POA: Insufficient documentation

## 2011-12-20 HISTORY — DX: Opioid abuse, uncomplicated: F11.10

## 2011-12-20 MED ORDER — TRAMADOL HCL 50 MG PO TABS: 50.0000 mg | ORAL_TABLET | Freq: Four times a day (QID) | ORAL | Status: DC | PRN

## 2011-12-20 MED ORDER — AMOXICILLIN 500 MG PO CAPS: 500.0000 mg | ORAL_CAPSULE | Freq: Three times a day (TID) | ORAL | Status: DC

## 2011-12-20 NOTE — ED Provider Notes (Signed)
Medical screening examination/treatment/procedure(s) were performed by non-physician practitioner and as supervising physician I was immediately available for consultation/collaboration.   Gwyneth Sprout, MD 12/20/11 1534

## 2011-12-20 NOTE — ED Notes (Signed)
Left lower toothache with gum swelling x 2 days-pt is at [facility] for heroin abuse x 1 month

## 2011-12-20 NOTE — ED Provider Notes (Signed)
History     CSN: [id number]  Arrival date & time 12/20/11  1314   First MD Initiated Contact with Patient 12/20/11 1354      Chief Complaint  Patient presents with  . Dental Pain    (Consider location/radiation/quality/duration/timing/severity/associated sxs/prior treatment) Patient is a 36 y.o. male presenting with tooth pain. The history is provided by the patient. No language interpreter was used.  Dental PainThe primary symptoms include mouth pain. The symptoms began more than 1 month ago. The symptoms are worsening. The symptoms are new. The symptoms occur constantly.  Additional symptoms include: gum swelling and gum tenderness.    Past Medical History  Diagnosis Date  . Heroin abuse     History reviewed. No pertinent past surgical history.  No family history on file.  History  Substance Use Topics  . Smoking status: Current Some Day Smoker  . Smokeless tobacco: Not on file  . Alcohol Use: No      Review of Systems  HENT: Positive for dental problem.   All other systems reviewed and are negative.    Allergies  Ibuprofen  Home Medications   Current Outpatient Rx  Name  Route  Sig  Dispense  Refill  . HYDROXYZINE PAMOATE 100 MG PO CAPS   Oral   Take 100 mg by mouth at bedtime as needed.           BP 113/73  Pulse 86  Temp 97.8 F (36.6 C)  Resp 16  Ht 5\' 7"  (1.702 m)  Wt 171 lb (77.565 kg)  BMI 26.78 kg/m2  SpO2 99%  Physical Exam  Nursing note and vitals reviewed. Constitutional: He appears well-developed and well-nourished.  HENT:  Head: Normocephalic and atraumatic.  Right Ear: External ear normal.  Left Ear: External ear normal.  Mouth/Throat: Oropharynx is clear and moist.  Eyes: Conjunctivae normal are normal. Pupils are equal, round, and reactive to light.  Neck: Normal range of motion.  Cardiovascular: Normal rate.   Pulmonary/Chest: Effort normal.  Musculoskeletal: Normal range of motion.  Neurological: He is alert.    Skin: Skin is warm.    ED Course  Procedures (including critical care time)  Labs Reviewed - No data to display No results found.   No diagnosis found.    MDM  Pt given rx for tramadol and amoxicillian        Lonia Skinner [place], [place] 12/20/11 1417

## 2013-03-26 ENCOUNTER — Emergency Department (HOSPITAL_COMMUNITY)
Admission: EM | Admit: 2013-03-26 | Discharge: 2013-03-26 | Disposition: A | Discharge status: Home / Self Care | Payer: Self-pay | Attending: Emergency Medicine | Admitting: Emergency Medicine

## 2013-03-26 ENCOUNTER — Encounter (HOSPITAL_COMMUNITY): Payer: Self-pay | Admitting: Emergency Medicine

## 2013-03-26 DIAGNOSIS — F17200 Nicotine dependence, unspecified, uncomplicated: Secondary | ICD-10-CM | POA: Insufficient documentation

## 2013-03-26 DIAGNOSIS — J069 Acute upper respiratory infection, unspecified: Secondary | ICD-10-CM | POA: Insufficient documentation

## 2013-03-26 MED ORDER — ACETAMINOPHEN-CODEINE 120-12 MG/5ML PO SUSP: 5.0000 mL | Freq: Four times a day (QID) | ORAL | Status: DC | PRN

## 2013-03-26 MED ORDER — SALINE SPRAY 0.65 % NA SOLN: 1.0000 | NASAL | Status: DC | PRN

## 2013-03-26 MED ORDER — PSEUDOEPHEDRINE HCL 60 MG PO TABS: 60.0000 mg | ORAL_TABLET | ORAL | Status: DC | PRN

## 2013-03-26 NOTE — ED Provider Notes (Signed)
CSN: [id number]     Arrival date & time 03/26/13  1632 History   First MD Initiated Contact with Patient 03/26/13 1759     Chief Complaint  Patient presents with  . Sore Throat  . Nasal Congestion  . Cough     (Consider location/radiation/quality/duration/timing/severity/associated sxs/prior Treatment) HPI Pt is a 38yo male presenting with 1 week hx of sneezing, rhinorrhea, cough, chills, and sore throat. Pt states he has been taking "everything" OTC w/o relief, including Dayquil, Nyquild, claritin, mucinex, alka-seltzer, and tylenol.  Denies fever, n/v/d. Denies hx of asthma. No sick contacts or recent travel. Denies chest pain or trouble breathing.   Past Medical History  Diagnosis Date  . Heroin abuse    History reviewed. No pertinent past surgical history. No family history on file. History  Substance Use Topics  . Smoking status: Current Some Day Smoker  . Smokeless tobacco: Not on file  . Alcohol Use: No    Review of Systems  Constitutional: Negative for fever and chills.  HENT: Positive for congestion and sore throat. Negative for ear pain, sinus pressure, trouble swallowing and voice change.   Gastrointestinal: Negative for nausea, vomiting, diarrhea and constipation.  All other systems reviewed and are negative.      Allergies  Ibuprofen  Home Medications   Current Outpatient Rx  Name  Route  Sig  Dispense  Refill  . Chlorphen-Phenyleph-ASA (ALKA-SELTZER PLUS COLD PO)   Oral   Take 2 tablets by mouth every 8 (eight) hours as needed (cold/flu symptoms).         . GuaiFENesin (MUCINEX PO)   Oral   Take 20 mLs by mouth every 4 (four) hours as needed (cold/flu symptoms/ congestion).         . hydrOXYzine (ATARAX/VISTARIL) 50 MG tablet   Oral   Take 100 mg by mouth at bedtime as needed (sleep).         . loratadine (CLARITIN) 10 MG tablet   Oral   Take 10 mg by mouth daily as needed for allergies.         . Phenylephrine-Acetaminophen  (QLEARQUIL SINUS & CONGESTION PO)   Oral   Take 2 capsules by mouth every 8 (eight) hours as needed (cold/flu symptoms).         . Pseudoeph-Doxylamine-DM-APAP (NYQUIL PO)   Oral   Take 2 capsules by mouth 2 (two) times daily as needed (cold/flu symptoms).         Marland Kitchen acetaminophen-codeine 120-12 MG/5ML suspension   Oral   Take 5 mLs by mouth every 6 (six) hours as needed for pain.   60 mL   0   . pseudoephedrine (SUDAFED) 60 MG tablet   Oral   Take 1 tablet (60 mg total) by mouth every 4 (four) hours as needed for congestion.   30 tablet   0   . sodium chloride (OCEAN) 0.65 % SOLN nasal spray   Each Nare   Place 1 spray into both nostrils as needed for congestion.   1 Bottle   0    BP 117/75  Pulse 62  Temp(Src) 97.5 F (36.4 C) (Oral)  Resp 18  SpO2 100% Physical Exam  Nursing note and vitals reviewed. Constitutional: He appears well-developed and well-nourished.  Pt lying comfortably in exam bed, NAD.   HENT:  Head: Normocephalic and atraumatic.  Right Ear: Hearing, tympanic membrane, external ear and ear canal normal.  Left Ear: Hearing, tympanic membrane, external ear and ear canal normal.  Nose: Mucosal edema present. Right sinus exhibits no maxillary sinus tenderness and no frontal sinus tenderness. Left sinus exhibits no maxillary sinus tenderness and no frontal sinus tenderness.  Mouth/Throat: Uvula is midline and mucous membranes are normal. Posterior oropharyngeal erythema present. No oropharyngeal exudate, posterior oropharyngeal edema or tonsillar abscesses.  Eyes: Conjunctivae are normal. No scleral icterus.  Neck: Normal range of motion. Neck supple.  Cardiovascular: Normal rate, regular rhythm and normal heart sounds.   Pulmonary/Chest: Effort normal and breath sounds normal. No stridor. No respiratory distress. He has no wheezes. He has no rales. He exhibits no tenderness.  No respiratory distress, able to speak in full sentences w/o difficulty.  Lungs: CTAB  Abdominal: Soft. Bowel sounds are normal. He exhibits no distension and no mass. There is no tenderness. There is no rebound and no guarding.  Musculoskeletal: Normal range of motion.  Lymphadenopathy:    He has no cervical adenopathy.  Neurological: He is alert.  Skin: Skin is warm and dry.    ED Course  Procedures (including critical care time) Labs Review Labs Reviewed - No data to display Imaging Review No results found.  EKG Interpretation   None       MDM   Final diagnoses:  URI, acute    Pt presenting with URI symptoms. Vitals: WNL, pt is afebrile, not hypoxic. Lungs: CTAB.  Pt does have mucosal edema and oropharyngeal erythema.  Low concern for pneumonia or bronchitis at this time. Do not believe CXR needed at this time.  Will tx as viral URI.  Rx: sudafed and ocean nasal spray. Reassured pt symptoms may last up to 2 weeks.  Pt info guide on URI provided. Return precautions provided. Pt verbalized understanding and agreement with tx plan.     Junius Finnerrin O'Malley, PA-C 03/27/13 1544

## 2013-03-26 NOTE — ED Notes (Signed)
Reports sneezing, runny nose, cough, chills and sore throat for 1 week. States has been taking OTC meds with no improvement. Pt is a x 4. NAD

## 2013-03-26 NOTE — Discharge Instructions (Signed)
Emergency Department Resource Guide °1) Find a Doctor and Pay Out of Pocket °Although you won't have to find out who is covered by your insurance plan, it is a good idea to ask around and get recommendations. You will then need to call the office and see if the doctor you have chosen will accept you as a new patient and what types of options they offer for patients who are self-pay. Some doctors offer discounts or will set up payment plans for their patients who do not have insurance, but you will need to ask so you aren't surprised when you get to your appointment. ° °2) Contact Your Local Health Department °Not all health departments have doctors that can see patients for sick visits, but many do, so it is worth a call to see if yours does. If you don't know where your local health department is, you can check in your phone book. The CDC also has a tool to help you locate your state's health department, and many state websites also have listings of all of their local health departments. ° °3) Find a Walk-in Clinic °If your illness is not likely to be very severe or complicated, you may want to try a walk in clinic. These are popping up all over the country in pharmacies, drugstores, and shopping centers. They're usually staffed by nurse practitioners or physician assistants that have been trained to treat common illnesses and complaints. They're usually fairly quick and inexpensive. However, if you have serious medical issues or chronic medical problems, these are probably not your best option. ° °No Primary Care Doctor: °- Call [facility] Connect at  832-8000 - they can help you locate a primary care doctor that  accepts your insurance, provides certain services, etc. °- Physician Referral Service- 1-800-533-3463 ° °Chronic Pain Problems: °Organization         Address  Phone   Notes  °[facility]  ([phone]) [phone] Patients need to be referred by their primary care doctor.  ° °Medication  Assistance: °Organization         Address  Phone   Notes  °[place] [facility] [address] °[place], [place] [address] ([phone]) 641-8030 --Must be a resident of [place] °-- Must have NO insurance coverage whatsoever (no Medicaid/ Medicare, etc.) °-- The pt. MUST have a primary care doctor that directs their care regularly and follows them in the community °  °[facility]  (866) 331-1348   °[employer]  (888) 892-1162   ° °Agencies that provide inexpensive medical care: °Organization         Address                                                       Phone                                                                            Notes  °[facility]  ([phone]) [phone]   °[facility]    (336)   [phone]   °[facility] [address] °[place], [place] [address] (336) 832-4777   °[facility] [address], °[place] (336) 271-4999   °[facility]    (336) 373-0678   °[facility]    (336) 272-1050   °[facility] ° [address], [place] Phone:  (336) 832-4444, Fax:  (336) [phone] Hours of Operation:  9 am - 6 pm, M-F.  Also accepts Medicaid/Medicare and self-pay.  °[facility] ° [address], [place] Phone: (336) [phone], Fax: ([phone]) [phone]. Hours of Operation:  8:30 am - 5:30 pm, M-F.  Also accepts Medicaid and self-pay.  °[facility] [place] [address], [place] Phone: (336) [phone]   °[facility] [address], [place], [place] (336)723-1848, Ext. 123 Mondays & Thursdays: 7-9 AM.  First 15 patients are seen on a first come, first serve basis. °  ° °Medicaid-accepting [place] Providers: ° °Organization         Address                                                                       Phone                               Notes  °[facility] [address], [place] (336) [phone] Also accepts self-pay patients.  °[facility] [address], [place] ° (336) [phone]   °[facility] [address], [place] (336) [phone]   °[facility] [address] [place] [address], [place] (336) [phone]   °[facility] [address], [place]  ° (336) [phone] Only accepts [place] Access Medicaid patients after they have their name applied to their card.  ° °Self-Pay (no insurance) in [place]: °  °Organization         Address                                                     Phone               Notes  °Sickle Cell Patients, [facility] [address], [place] (336) [phone]   °[facility] [address], [place] (336) [phone]   °[facility] ° [address], [place] (336) [phone]   °[facility]/Dr. Osei-Bonsu ° [address] [place] [address], [place] or [address], [place] ([phone]) [phone] Phone number for both [place] and [place] locations is the same.  °[facility] [address], [place] (336) [phone]   °[facility] [address] [place] [address], [place] or [address] (336) 852-7530 °(336) 878-2260   °[facility] [address],  (336) 350-1642, phone; (336) [phone], fax Sees patients 1st and 3rd Saturday of   every month.  Must not qualify for public or private insurance (i.e. Medicaid, Medicare,  [place] Health Choice, Veterans' Benefits) • Household income should be no more than 200% of the poverty level •The clinic cannot treat you if you are pregnant or think you are pregnant • Sexually transmitted diseases are not treated at the clinic.  ° °_____________Dental Care:______________ °Organization         Address                                  Phone                       Notes  °[place]  [facility] [address], [place] (336) [phone] Accepts children up to age 21 who are enrolled in Medicaid or [place] Health Choice; pregnant women with a Medicaid card; and children who have applied for Medicaid or [place] Health Choice, but were declined, whose parents can pay a reduced fee at time of service.  °[place] [facility] [place]  [address], [place] (336) 641-7733 Accepts children up to age 21 who are enrolled in Medicaid or [place] Health Choice; pregnant women with a Medicaid card; and children who have applied for Medicaid or [place] Health Choice, but were declined, whose parents can pay a reduced fee at time of service.  °[facility] ° [address], [place] (336) [phone] Patients are seen by appointment only. Walk-ins are not accepted. [facility] will see patients 18 years of age and older. °Monday - Tuesday (8am-5pm) °Most Wednesdays (8:30-5pm) °$30 per visit, cash only  °[facility] ° [address], [place] ([phone]) [phone] Patients are seen by appointment only. Walk-ins are not accepted. [facility] will see patients 18 years of age and older. °One Wednesday Evening (Monthly: Volunteer Based).  $30 per visit, cash only  °[facility]  (919) [phone] for adults; Children under age 4, call Graduate Pediatric Dentistry at ([phone]) [phone]. Children aged 4-14, please call ([phone]) [phone] to request a pediatric application. ° Dental services are provided in all areas of dental care including fillings, crowns and bridges, complete and partial dentures, implants, gum treatment, root canals, and extractions. Preventive care is also provided. Treatment is provided to both adults and children. °Patients are selected via a lottery and there is often a waiting list. °  °[facility] [address], °[place] ° (336) 763-8833  [email or web address] °  °[facility] [address], [place], [place] (336)723-1848, Ext. 123 Second and Fourth Thursday of each month, opens at 6:30 AM; Clinic ends at 9 AM.  Patients are seen on a first-come first-served basis, and a limited number are seen during each clinic.  ° °[facility] ° [address], [place], [place] (336) [phone]   Eligibility Requirements °You must have lived in [place], [place], or [place] counties for at least the last three months. °  You cannot be eligible for state or federal sponsored healthcare insurance, including Veterans Administration, Medicaid, or Medicare. °  You generally cannot be eligible for healthcare insurance through your employer.  °  How to apply: °Eligibility screenings are held every Tuesday and Wednesday afternoon from 1:00 pm until 4:00 pm. You do not need an appointment for the interview!  °  [facility] [address], [place], [place] [phone]   °[facility]  336-342-8273   °[facility]  [phone]   °[facility]  [phone]   ° °

## 2013-03-28 NOTE — ED Provider Notes (Signed)
Medical screening examination/treatment/procedure(s) were performed by non-physician practitioner and as supervising physician I was immediately available for consultation/collaboration.  EKG Interpretation   None        Fern Asmar F Doloros Kwolek, MD 03/28/13 0549 

## 2013-07-30 ENCOUNTER — Emergency Department (HOSPITAL_COMMUNITY)
Admission: EM | Admit: 2013-07-30 | Discharge: 2013-07-30 | Disposition: A | Discharge status: Home / Self Care | Payer: Self-pay | Attending: Emergency Medicine | Admitting: Emergency Medicine

## 2013-07-30 ENCOUNTER — Encounter (HOSPITAL_COMMUNITY): Payer: Self-pay | Admitting: Emergency Medicine

## 2013-07-30 DIAGNOSIS — IMO0002 Reserved for concepts with insufficient information to code with codable children: Secondary | ICD-10-CM | POA: Insufficient documentation

## 2013-07-30 DIAGNOSIS — F17200 Nicotine dependence, unspecified, uncomplicated: Secondary | ICD-10-CM | POA: Insufficient documentation

## 2013-07-30 DIAGNOSIS — G8929 Other chronic pain: Secondary | ICD-10-CM | POA: Insufficient documentation

## 2013-07-30 DIAGNOSIS — F1110 Opioid abuse, uncomplicated: Secondary | ICD-10-CM | POA: Insufficient documentation

## 2013-07-30 DIAGNOSIS — M25562 Pain in left knee: Secondary | ICD-10-CM

## 2013-07-30 DIAGNOSIS — Z79899 Other long term (current) drug therapy: Secondary | ICD-10-CM | POA: Insufficient documentation

## 2013-07-30 HISTORY — DX: Pain in left knee: M25.562

## 2013-07-30 MED ORDER — PREDNISONE 20 MG PO TABS
60.0000 mg | ORAL_TABLET | Freq: Once | ORAL | Status: AC
  Administered 2013-07-30: 60 mg via ORAL
  Filled 2013-07-30: qty 3

## 2013-07-30 MED ORDER — HYDROCODONE-ACETAMINOPHEN 5-325 MG PO TABS
1.0000 | ORAL_TABLET | Freq: Once | ORAL | Status: AC
  Administered 2013-07-30: 1 via ORAL
  Filled 2013-07-30: qty 1

## 2013-07-30 MED ORDER — HYDROCODONE-ACETAMINOPHEN 5-325 MG PO TABS: ORAL_TABLET | ORAL | Status: DC

## 2013-07-30 MED ORDER — PREDNISONE 20 MG PO TABS: 40.0000 mg | ORAL_TABLET | Freq: Every day | ORAL | Status: DC

## 2013-07-30 NOTE — ED Notes (Signed)
Presents with lefty knee pain this episode began one week ago-HX of chronic knee pain for 15 years-cortisone shots help pain. CMS intact. Pain is worse with movement and while at work.

## 2013-07-30 NOTE — ED Provider Notes (Signed)
CSN: [id number]     Arrival date & time 07/30/13  1950 History  This chart was scribed for Wynetta EmeryNicole Pisciotta, PA-C working with Rolland PorterMark James, MD by Evon Slackerrance Branch, ED Scribe. This patient was seen in room TR08C/TR08C and the patient's care was started at 8:14 PM.      Chief Complaint  Patient presents with  . Knee Pain   Patient is a 38 y.o. male presenting with knee pain. The history is provided by the patient. No language interpreter was used.  Knee Pain  HPI Comments: William MiresBobby Bass is a 38 y.o. male who presents to the Emergency Department complaining of atraumatic left knee pain onset 1 week. He states he has chronic knee pain. He states that he has been taking tylenol with no relief. He states that he has received cortisone shots before that provided him relief up until recently. He states the he think the pain is from moving on his feet all day at work.   Past Medical History  Diagnosis Date  . Heroin abuse   . Knee pain, left    History reviewed. No pertinent past surgical history. History reviewed. No pertinent family history. History  Substance Use Topics  . Smoking status: Current Some Day Smoker  . Smokeless tobacco: Not on file  . Alcohol Use: No    Review of Systems  Musculoskeletal: Positive for arthralgias.   A complete 10 system review of systems was obtained and all systems are negative except as noted in the HPI and PMH.   Allergies  Ibuprofen  Home Medications   Prior to Admission medications   Medication Sig Start Date End Date Taking? Authorizing Marcell Pfeifer  HYDROcodone-acetaminophen (NORCO/VICODIN) 5-325 MG per tablet Take 1-2 tablets by mouth every 6 hours as needed for pain. 07/30/13   Nicole Pisciotta, PA-C  predniSONE (DELTASONE) 20 MG tablet Take 2 tablets (40 mg total) by mouth daily. 07/30/13   Nicole Pisciotta, PA-C   Triage Vitals: BP 131/85  Pulse 77  Temp(Src) 98.4 F (36.9 C) (Oral)  Resp 16  SpO2 99%  Physical Exam  Nursing note and vitals  reviewed. Constitutional: He is oriented to person, place, and time. He appears well-developed and well-nourished. No distress.  HENT:  Head: Normocephalic and atraumatic.  Eyes: Conjunctivae and EOM are normal.  Neck: Neck supple. No tracheal deviation present.  Cardiovascular: Normal rate, regular rhythm and intact distal pulses.   Pulmonary/Chest: Effort normal. No respiratory distress.  Abdominal: Soft.  Musculoskeletal: Normal range of motion. He exhibits tenderness. He exhibits no edema.  Left knee:  No deformity, erythema or abrasions. FROM. No effusion or crepitance. Anterior and posterior drawer show no abnormal laxity. Stable to valgus and varus stress. Diffusely tender to palpation on anterior knee, no point tenderness. Neurovascularly intact. Pt ambulates with non-antalgic gait.    Neurological: He is alert and oriented to person, place, and time.  Skin: Skin is warm and dry.  Psychiatric: He has a normal mood and affect. His behavior is normal.    ED Course  Procedures (including critical care time) DIAGNOSTIC STUDIES: Oxygen Saturation is 99% on RA, normal by my interpretation.    COORDINATION OF CARE:    Labs Review Labs Reviewed - No data to display  Imaging Review No results found.   EKG Interpretation None      MDM   Final diagnoses:  Chronic knee pain, left   Filed Vitals:   07/30/13 2008 07/30/13 2049  BP: 131/85 135/82  Pulse: 77 95  Temp: 98.4 F (36.9 C) 97.7 F (36.5 C)  TempSrc: Oral Oral  Resp: 16   SpO2: 99% 96%    Medications  predniSONE (DELTASONE) tablet 60 mg (60 mg Oral Given 07/30/13 2044)  HYDROcodone-acetaminophen (NORCO/VICODIN) 5-325 MG per tablet 1 tablet (1 tablet Oral Given 07/30/13 2044)    William MiresBobby Bass is a 38 y.o. male presenting with atraumatic exacerbation of left knee pain. Physical exam with no abnormalities: No indication for imaging at this time.  Evaluation does not show pathology that would require ongoing  emergent intervention or inpatient treatment. Pt is hemodynamically stable and mentating appropriately. Discussed findings and plan with patient/guardian, who agrees with care plan. All questions answered. Return precautions discussed and outpatient follow up given.   Discharge Medication List as of 07/30/2013  8:43 PM    START taking these medications   Details  HYDROcodone-acetaminophen (NORCO/VICODIN) 5-325 MG per tablet Take 1-2 tablets by mouth every 6 hours as needed for pain., Print    predniSONE (DELTASONE) 20 MG tablet Take 2 tablets (40 mg total) by mouth daily., Starting 07/30/2013, Until Discontinued, Print              I personally performed the services described in this documentation, which was scribed in my presence. The recorded information has been reviewed and is accurate.      Wynetta Emeryicole Pisciotta, PA-C 07/30/13 2059

## 2013-07-30 NOTE — Discharge Instructions (Signed)
Take vicodin for breakthrough pain, do not drink alcohol, drive, care for children or do other critical tasks while taking vicodin.  Do not hesitate to return to the Emergency Department for any new, worsening or concerning symptoms.   If you do not have a primary care doctor you can establish one at the   [facility]: [address] [place] [id number] [phone]  After you establish care. Let them know you were seen in the emergency room. They must obtain records for further management.

## 2013-08-06 NOTE — ED Provider Notes (Signed)
Medical screening examination/treatment/procedure(s) were performed by non-physician practitioner and as supervising physician I was immediately available for consultation/collaboration.   EKG Interpretation None        Mark James, MD 08/06/13 0305 

## 2014-01-10 ENCOUNTER — Emergency Department (HOSPITAL_COMMUNITY): Payer: Self-pay

## 2014-01-10 ENCOUNTER — Encounter (HOSPITAL_COMMUNITY): Payer: Self-pay | Admitting: Emergency Medicine

## 2014-01-10 ENCOUNTER — Emergency Department (HOSPITAL_COMMUNITY)
Admission: EM | Admit: 2014-01-10 | Discharge: 2014-01-10 | Disposition: A | Discharge status: Home / Self Care | Payer: Self-pay | Attending: Emergency Medicine | Admitting: Emergency Medicine

## 2014-01-10 DIAGNOSIS — Z7952 Long term (current) use of systemic steroids: Secondary | ICD-10-CM | POA: Insufficient documentation

## 2014-01-10 DIAGNOSIS — Z720 Tobacco use: Secondary | ICD-10-CM | POA: Insufficient documentation

## 2014-01-10 DIAGNOSIS — G5601 Carpal tunnel syndrome, right upper limb: Secondary | ICD-10-CM | POA: Insufficient documentation

## 2014-01-10 MED ORDER — HYDROCODONE-ACETAMINOPHEN 5-325 MG PO TABS: 1.0000 | ORAL_TABLET | ORAL | Status: DC | PRN

## 2014-01-10 MED ORDER — TRAMADOL HCL 50 MG PO TABS
50.0000 mg | ORAL_TABLET | Freq: Once | ORAL | Status: AC
  Administered 2014-01-10: 50 mg via ORAL
  Filled 2014-01-10: qty 1

## 2014-01-10 MED ORDER — PREDNISONE 20 MG PO TABS: 40.0000 mg | ORAL_TABLET | Freq: Every day | ORAL | Status: DC

## 2014-01-10 NOTE — Discharge Instructions (Signed)
Carpal Tunnel Syndrome The carpal tunnel is a narrow area located on the palm side of your wrist. The tunnel is formed by the wrist bones and ligaments. Nerves, blood vessels, and tendons pass through the carpal tunnel. Repeated wrist motion or certain diseases may cause swelling within the tunnel. This swelling pinches the main nerve in the wrist (median nerve) and causes the painful hand and arm condition called carpal tunnel syndrome. CAUSES   Repeated wrist motions.  Wrist injuries.  Certain diseases like arthritis, diabetes, alcoholism, hyperthyroidism, and kidney failure.  Obesity.  Pregnancy. SYMPTOMS   A "pins and needles" feeling in your fingers or hand, especially in your thumb, index and middle fingers.  Tingling or numbness in your fingers or hand.  An aching feeling in your entire arm, especially when your wrist and elbow are bent for long periods of time.  Wrist pain that goes up your arm to your shoulder.  Pain that goes down into your palm or fingers.  A weak feeling in your hands. DIAGNOSIS  Your health care provider will take your history and perform a physical exam. An electromyography test may be needed. This test measures electrical signals sent out by your nerves into the muscles. The electrical signals are usually slowed by carpal tunnel syndrome. You may also need X-rays. TREATMENT  Carpal tunnel syndrome may clear up by itself. Your health care provider may recommend a wrist splint or medicine such as a nonsteroidal anti-inflammatory medicine. Cortisone injections may help. Sometimes, surgery may be needed to free the pinched nerve.  HOME CARE INSTRUCTIONS   Take all medicine as directed by your health care provider. Only take over-the-counter or prescription medicines for pain, discomfort, or fever as directed by your health care provider.  If you were given a splint to keep your wrist from bending, wear it as directed. It is important to wear the splint at  night. Wear the splint for as long as you have pain or numbness in your hand, arm, or wrist. This may take 1 to 2 months.  Rest your wrist from any activity that may be causing your pain. If your symptoms are work-related, you may need to talk to your employer about changing to a job that does not require using your wrist.  Put ice on your wrist after long periods of wrist activity.  Put ice in a plastic bag.  Place a towel between your skin and the bag.  Leave the ice on for 15-20 minutes, 03-04 times a day.  Keep all follow-up visits as directed by your health care provider. This includes any orthopedic referrals, physical therapy, and rehabilitation. Any delay in getting necessary care could result in a delay or failure of your condition to heal. SEEK IMMEDIATE MEDICAL CARE IF:   You have new, unexplained symptoms.  Your symptoms get worse and are not helped or controlled with medicines. MAKE SURE YOU:   Understand these instructions.  Will watch your condition.  Will get help right away if you are not doing well or get worse. Document Released: 01/15/2000 Document Revised: 06/03/2013 Document Reviewed: 12/03/2010 [facility] Patient Information 2015 [place], [place]. This information is not intended to replace advice given to you by your health care provider. Make sure you discuss any questions you have with your health care provider.  Carpal Tunnel Release Carpal tunnel release is done to relieve the pressure on the nerves and tendons on the bottom side of your wrist.  LET YOUR CAREGIVER KNOW ABOUT:  Allergies to food or medicine.  Medicines taken, including vitamins, herbs, eyedrops, over-the-counter medicines, and creams.  Use of steroids (by mouth or creams).  Previous problems with anesthetics or numbing medicines.  History of bleeding problems or blood clots.  Previous surgery.  Other health problems, including diabetes and kidney problems.  Possibility of  pregnancy, if this applies. RISKS AND COMPLICATIONS  Some problems that may happen after this procedure include:  Infection.  Damage to the nerves, arteries or tendons could occur. This would be very uncommon.  Bleeding. BEFORE THE PROCEDURE   This surgery may be done while you are asleep (general anesthetic) or may be done under a block where only your forearm and the surgical area is numb.  If the surgery is done under a block, the numbness will gradually wear off within several hours after surgery. HOME CARE INSTRUCTIONS   Have a responsible person with you for 24 hours.  Do not drive a car or use public transportation for 24 hours.  Only take over-the-counter or prescription medicines for pain, discomfort, or fever as directed by your caregiver. Take them as directed.  You may put ice on the palm side of the affected wrist.  Put ice in a plastic bag.  Place a towel between your skin and the bag.  Leave the ice on for 20 to 30 minutes, 4 times per day.  If you were given a splint to keep your wrist from bending, use it as directed. It is important to wear the splint at night or as directed. Use the splint for as long as you have pain or numbness in your hand, arm, or wrist. This may take 1 to 2 months.  Keep your hand raised (elevated) above the level of your heart as much as possible. This keeps swelling down and helps with discomfort.  Change bandages (dressings) as directed.  Keep the wound clean and dry. SEEK MEDICAL CARE IF:   You develop pain not relieved with medications.  You develop numbness of your hand.  You develop bleeding from your surgical site.  You have an oral temperature above 102 F (38.9 C).  You develop redness or swelling of the surgical site.  You develop new, unexplained problems. SEEK IMMEDIATE MEDICAL CARE IF:   You develop a rash.  You have difficulty breathing.  You develop any reaction or side effects to medications  given. Document Released: 04/09/2003 Document Revised: 04/11/2011 Document Reviewed: 11/23/2006 [facility] Patient Information 2015 [place]. This information is not intended to replace advice given to you by your health care provider. Make sure you discuss any questions you have with your health care provider.

## 2014-01-10 NOTE — ED Notes (Signed)
Pt c/o right wrist pain x 1 week worse with movement; CMS intact

## 2014-01-10 NOTE — ED Provider Notes (Signed)
CSN: [id number]     Arrival date & time 01/10/14  [id number] History  This chart was scribed for non-physician practitioner, Marlon Peliffany Sargon Scouten, PA-C,working with Rolan BuccoMelanie Belfi, MD, by Karle PlumberJennifer Tensley, ED Scribe. This patient was seen in room TR10C/TR10C and the patient's care was started at 8:00 PM.  Chief Complaint  Patient presents with  . Wrist Pain   Patient is a 38 y.o. male presenting with wrist pain. The history is provided by the patient. No language interpreter was used.  Wrist Pain    HPI Comments:  William Bass is a 38 y.o. male who presents to the Emergency Department complaining of burning, tingling and numbness in the right wrist that began approximately one week ago. He is wearing a wrist brace that he states he got last time his wrist is bothering him. 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 Pt reports he has been treated in the past for this pain with Prednisone with success. He has not taken anything for his pain. Movement makes the pain worse. Denies alleviating factors. Denies fever, chills, nausea or vomiting. Denies trauma, injury or fall. PMH of heroin abuse and left knee pain.   Past Medical History  Diagnosis Date  . Heroin abuse   . Knee pain, left    History reviewed. No pertinent past surgical history. History reviewed. No pertinent family history. History  Substance Use Topics  . Smoking status: Current Some Day Smoker  . Smokeless tobacco: Not on file  . Alcohol Use: No    Review of Systems  Constitutional: Negative for fever and chills.  Gastrointestinal: Negative for nausea and vomiting.  Musculoskeletal: Positive for arthralgias.  Skin: Negative for wound.  Neurological: Positive for numbness.  All other systems reviewed and are negative.   Allergies  Ibuprofen  Home Medications   Prior to Admission medications   Medication Sig Start Date End Date Taking? Authorizing Provider   HYDROcodone-acetaminophen (NORCO/VICODIN) 5-325 MG per tablet Take 1-2 tablets by mouth every 4 (four) hours as needed. 01/10/14   Ileen Kahre Irine SealG Quintavia Rogstad, PA-C  predniSONE (DELTASONE) 20 MG tablet Take 2 tablets (40 mg total) by mouth daily. 01/10/14   Dorthula Matasiffany G Dinorah Masullo, PA-C   Triage Vitals: BP 128/84 mmHg  Pulse 57  Temp(Src) 98 F (36.7 C) (Oral)  Resp 16  SpO2 98% Physical Exam  Constitutional: He is oriented to person, place, and time. He appears well-developed and well-nourished.  HENT:  Head: Normocephalic and atraumatic.  Eyes: EOM are normal.  Neck: Normal range of motion.  Cardiovascular: Normal rate.   Pulmonary/Chest: Effort normal.  Musculoskeletal:       Right wrist: He exhibits decreased range of motion, tenderness, swelling and effusion. He exhibits no bony tenderness, no crepitus, no deformity and no laceration.  + tinel's sign   Neurological: He is alert and oriented to person, place, and time.  Skin: Skin is warm and dry.  Psychiatric: He has a normal mood and affect. His behavior is normal.  Nursing note and vitals reviewed.   ED Course  Procedures (including critical care time) DIAGNOSTIC STUDIES: Oxygen Saturation is 98% on RA, normal by my interpretation.   COORDINATION OF CARE: 8:06 PM- Will prescribe pain medication and Prednisone. Will provide work note and splint right wrist. Pt needs to see hand specialist, will give a referral. Pt verbalizes understanding and agrees to plan.  Medications  traMADol (ULTRAM) tablet 50 mg (not administered)    Labs Review  Labs Reviewed - No data to display  Imaging Review Dg Wrist Complete Right  01/10/2014   CLINICAL DATA:  Injury to the right wrist. A gate dropped onto the wrist. Wrist pain.  EXAM: RIGHT WRIST - COMPLETE 3+ VIEW  COMPARISON:  None.  FINDINGS: There is no evidence of fracture or dislocation. There is no evidence of arthropathy or other focal bone abnormality.  IMPRESSION: Negative.   Electronically  Signed   By: Herbie BaltimoreWalt  Liebkemann M.D.   On: 01/10/2014 19:52     EKG Interpretation None      MDM   Final diagnoses:  Carpal tunnel syndrome of right wrist    38 y.o.William Bass's evaluation in the Emergency Department is complete. It has been determined that no acute conditions requiring further emergency intervention are present at this time. The patient/guardian have been advised of the diagnosis and plan. We have discussed signs and symptoms that warrant return to the ED, such as changes or worsening in symptoms.  Vital signs are stable at discharge. Filed Vitals:   01/10/14 1857  BP: 128/84  Pulse: 57  Temp: 98 F (36.7 C)  Resp: 16    Patient/guardian has voiced understanding and agreed to follow-up with the PCP or specialist.   I personally performed the services described in this documentation, which was scribed in my presence. The recorded information has been reviewed and is accurate.    Dorthula Matasiffany G Mashonda Broski, PA-C 01/10/14 2011  Rolan BuccoMelanie Belfi, MD 01/10/14 [phone]

## 2014-02-03 ENCOUNTER — Encounter (HOSPITAL_COMMUNITY): Payer: Self-pay | Admitting: Emergency Medicine

## 2014-02-03 ENCOUNTER — Emergency Department (HOSPITAL_COMMUNITY): Payer: Self-pay

## 2014-02-03 ENCOUNTER — Emergency Department (HOSPITAL_COMMUNITY)
Admission: EM | Admit: 2014-02-03 | Discharge: 2014-02-04 | Disposition: A | Discharge status: Home / Self Care | Payer: Self-pay | Attending: Emergency Medicine | Admitting: Emergency Medicine

## 2014-02-03 DIAGNOSIS — M25562 Pain in left knee: Secondary | ICD-10-CM | POA: Insufficient documentation

## 2014-02-03 DIAGNOSIS — Z87891 Personal history of nicotine dependence: Secondary | ICD-10-CM | POA: Insufficient documentation

## 2014-02-03 MED ORDER — HYDROCODONE-ACETAMINOPHEN 5-325 MG PO TABS
2.0000 | ORAL_TABLET | Freq: Once | ORAL | Status: AC
  Administered 2014-02-03: 2 via ORAL
  Filled 2014-02-03: qty 2

## 2014-02-03 MED ORDER — HYDROCODONE-ACETAMINOPHEN 5-325 MG PO TABS: 2.0000 | ORAL_TABLET | ORAL | Status: DC | PRN

## 2014-02-03 NOTE — Discharge Instructions (Signed)
Is important for you to follow-up with primary care and orthopedics for further evaluation and management of your symptoms. There does not appear to be an emergent cause for your symptoms today. Please take your pain medicine as prescribed. You may continue to use your compressive sleeve on your knee. Return to ED for worsening symptoms

## 2014-02-03 NOTE — ED Notes (Addendum)
Noticed 2 days ago.  Hx of knee problems.  Used to wear a brace.  States my right knee is full of fluid and hurts.

## 2014-02-03 NOTE — ED Notes (Signed)
Pt reports swelling to left knee around 2 days ago. Pt states he has problems with the knee but it has never been this bad. Pt states he takes tylenol for pain but can't take ibuprofen. Pt states he has been on the knee a lot at work. Pt rates pain 10/10. Swelling noted. Pt states he feels like he has fluid on his knee.

## 2014-02-03 NOTE — ED Provider Notes (Signed)
CSN: [id number]     Arrival date & time 02/03/14  2045 History  This chart was scribed for non-physician practitioner, Joycie Peek, PA-C working with Joya Gaskins, MD by Greggory Stallion, ED scribe. This patient was seen in room TR09C/TR09C and the patient's care was started at 11:12 PM.    Chief Complaint  Patient presents with  . Joint Swelling    bilateral knees.   The history is provided by the patient. No language interpreter was used.    HPI Comments: William Bass is a 39 y.o. male who presents to the Emergency Department complaining of worsening left knee swelling and pain that started 2 days ago. Denies injury but reports history of knee problems. Bending his knee worsens the pain but straightening it relieves some pain. He has taken tylenol with no relief. Denies fever, numbness, weakness. Denies history of STDs.  Past Medical History  Diagnosis Date  . Heroin abuse   . Knee pain, left    History reviewed. No pertinent past surgical history. No family history on file. History  Substance Use Topics  . Smoking status: Former Games developer  . Smokeless tobacco: Not on file  . Alcohol Use: No    Review of Systems  Constitutional: Negative for fever.  Musculoskeletal: Positive for joint swelling and arthralgias.  Neurological: Negative for weakness and numbness.  All other systems reviewed and are negative.  Allergies  Ibuprofen  Home Medications   Prior to Admission medications   Medication Sig Start Date End Date Taking? Authorizing Provider  HYDROcodone-acetaminophen (NORCO/VICODIN) 5-325 MG per tablet Take 2 tablets by mouth every 4 (four) hours as needed for moderate pain or severe pain. 02/03/14   Earle Gell Harol Shabazz, PA-C  predniSONE (DELTASONE) 20 MG tablet Take 2 tablets (40 mg total) by mouth daily. Patient not taking: Reported on 02/03/2014 01/10/14   Dorthula Matas, PA-C   BP 118/71 mmHg  Pulse 78  Temp(Src) 97.9 F (36.6 C)  Resp 18  Ht  (1.702 m)   Wt 187 lb (84.823 kg)  BMI 29.28 kg/m2  SpO2 97%   Physical Exam  Constitutional: He is oriented to person, place, and time. He appears well-developed and well-nourished. No distress.  HENT:  Head: Normocephalic and atraumatic.  Mouth/Throat: Oropharynx is clear and moist and mucous membranes are normal.  Eyes: Conjunctivae and EOM are normal.  Neck: Neck supple. No tracheal deviation present.  Cardiovascular: Normal rate, regular rhythm, S1 normal, S2 normal, normal heart sounds and intact distal pulses.   Pulmonary/Chest: Effort normal and breath sounds normal. No respiratory distress. He has no wheezes. He has no rhonchi. He has no rales.  Clear to auscultation bilaterally.   Abdominal: Soft. There is no tenderness.  Musculoskeletal: Normal range of motion.  Left knee with moderate effusion. No erythema or overt warmth. ROM decreased due to discomfort. No obvious lesions or deformities. No ligamentous laxity.   Neurological: He is alert and oriented to person, place, and time.  Motor and sensation 5/5.  Skin: Skin is warm and dry.  Psychiatric: He has a normal mood and affect. His behavior is normal.  Nursing note and vitals reviewed.   ED Course  Procedures (including critical care time)  DIAGNOSTIC STUDIES: Oxygen Saturation is 96% on RA, normal by my interpretation.    COORDINATION OF CARE: 11:19 PM-Discussed treatment plan which includes knee xray with pt at bedside and pt agreed to plan. Will give pt an orthopedic referral and advised him to follow up.  Labs Review Labs Reviewed - No data to display  Imaging Review Dg Knee Complete 4 Views Left  02/03/2014   CLINICAL DATA:  Left knee swelling and pain.  No known injury.  EXAM: LEFT KNEE - COMPLETE 4+ VIEW  COMPARISON:  08/10/2007  FINDINGS: Minimal hypertrophic degenerative changes of the patella. Small left knee effusion. No evidence of acute fracture or subluxation. No focal bone lesion or bone destruction. Bone cortex  and trabecular architecture appear intact. No radiopaque soft tissue foreign bodies.  IMPRESSION: Minimal degenerative change. Small effusion. No acute bony abnormalities.   Electronically Signed   By: Burman Nieves M.D.   On: 02/03/2014 23:41     EKG Interpretation None     Meds given in ED:  Medications  HYDROcodone-acetaminophen (NORCO/VICODIN) 5-325 MG per tablet 2 tablet (2 tablets Oral Given 02/03/14 2328)    Discharge Medication List as of 02/04/2014 12:00 AM     Filed Vitals:   02/03/14 2113 02/03/14 2115 02/03/14 2116 02/04/14 0001  BP:  121/76  118/71  Pulse:  77  78  Temp:  98.1 F (36.7 C)  97.9 F (36.6 C)  Resp:  18  18  Height:   (1.702 m)    Weight:  187 lb (84.823 kg)    SpO2: 96% 96% 96% 97%    MDM  William Bass is a 39 y.o. male who presents for evaluation of R knee discomfort. Reports he has had problems with this knee in the past. 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 No fevers, redness or streaking.  Vitals stable - WNL -afebrile Pt resting comfortably in ED. PE--mild edema in L knee. No erythema or warmth noted. No lesions. Mild tenderness to medial and lateral joint line with no focal tenderness. ROM decreased due to discomfort. DP intact. Motor and Sensation 5/5. Imaging--Xray of L knee shows mild effusion. No acute fractures, dislocations or other osseus abnormalities.  DDX-- PT has experienced these symptoms before with relation to his workload at his job. This is likely effusion due to overuse or unknown injury sustained at his job. Low concern for hemarthrosis, septic joint, other infection, fracture or other emergent pathology. Will DC with short course pain medicines as pt reports allergy to Ibuprofen, Encouraged use of compression ace wrap, RICE therapy and follow up with Ortho for definitive care. Discussed f/u with PCP and return precautions, pt very amenable to plan. Pt stable, in good condition and ambulates out of ED  without difficulty.   Final diagnoses:  Left knee pain     I personally performed the services described in this documentation, which was scribed in my presence. The recorded information has been reviewed and is accurate.  Earle Gell McFarland, PA-C 02/04/14 1421  Joya Gaskins, MD 02/05/14 [phone]

## 2014-03-09 ENCOUNTER — Emergency Department (HOSPITAL_COMMUNITY): Payer: Self-pay

## 2014-03-09 ENCOUNTER — Encounter (HOSPITAL_COMMUNITY): Payer: Self-pay | Admitting: Physical Medicine and Rehabilitation

## 2014-03-09 ENCOUNTER — Emergency Department (HOSPITAL_COMMUNITY)
Admission: EM | Admit: 2014-03-09 | Discharge: 2014-03-09 | Disposition: A | Discharge status: Home / Self Care | Payer: Self-pay | Attending: Emergency Medicine | Admitting: Emergency Medicine

## 2014-03-09 DIAGNOSIS — R05 Cough: Secondary | ICD-10-CM

## 2014-03-09 DIAGNOSIS — Z87891 Personal history of nicotine dependence: Secondary | ICD-10-CM | POA: Insufficient documentation

## 2014-03-09 DIAGNOSIS — J069 Acute upper respiratory infection, unspecified: Secondary | ICD-10-CM | POA: Insufficient documentation

## 2014-03-09 DIAGNOSIS — R059 Cough, unspecified: Secondary | ICD-10-CM

## 2014-03-09 DIAGNOSIS — Z8739 Personal history of other diseases of the musculoskeletal system and connective tissue: Secondary | ICD-10-CM | POA: Insufficient documentation

## 2014-03-09 MED ORDER — BENZONATATE 100 MG PO CAPS: 100.0000 mg | ORAL_CAPSULE | Freq: Three times a day (TID) | ORAL | Status: DC

## 2014-03-09 MED ORDER — OXYMETAZOLINE HCL 0.05 % NA SOLN: 1.0000 | Freq: Two times a day (BID) | NASAL | Status: DC

## 2014-03-09 NOTE — Discharge Instructions (Signed)
Read the instructions below on reasons to return to the emergency department and to learn more about your diagnosis.  Use over the counter medications for symptomatic relief as we discussed (musinex as a decongestant, Tylenol for fever/pain, Motrin/Ibuprofen for muscle aches). .  Your more than welcome to return to the emergency department if symptoms worsen or become concerning.  Upper Respiratory Infection, Adult  An upper respiratory infection (URI) is also sometimes known as the common cold. Most people improve within 1 week, but symptoms can last up to 2 weeks. A residual cough may last even longer.   URI is most commonly caused by a virus. Viruses are NOT treated with antibiotics. You can easily spread the virus to others by oral contact. This includes kissing, sharing a glass, coughing, or sneezing. Touching your mouth or nose and then touching a surface, which is then touched by another person, can also spread the virus.   TREATMENT  Treatment is directed at relieving symptoms. There is no cure. Antibiotics are not effective, because the infection is caused by a virus, not by bacteria. Treatment may include:  Increased fluid intake. Sports drinks offer valuable electrolytes, sugars, and fluids.  Breathing heated mist or steam (vaporizer or shower).  Eating chicken soup or other clear broths, and maintaining good nutrition.  Getting plenty of rest.  Using gargles or lozenges for comfort.  Controlling fevers with ibuprofen or acetaminophen as directed by your caregiver.  Increasing usage of your inhaler if you have asthma.  Return to work when your temperature has returned to normal.   SEEK MEDICAL CARE IF:  After the first few days, you feel you are getting worse rather than better.  You develop worsening shortness of breath, or brown or red sputum. These may be signs of pneumonia.  You develop yellow or brown nasal discharge or pain in the face, especially when you bend forward. These  may be signs of sinusitis.  You develop a fever, swollen neck glands, pain with swallowing, or white areas in the back of your throat. These may be signs of strep throat.

## 2014-03-09 NOTE — ED Notes (Signed)
Pt presents to department for evaluation of sinus congestion, ongoing x2 weeks. Respirations unlabored. Pt is alert and oriented x4.

## 2014-03-09 NOTE — ED Provider Notes (Signed)
CSN: [id number]     Arrival date & time 03/09/14  1142 History   First MD Initiated Contact with Patient 03/09/14 1148     Chief Complaint  Patient presents with  . Nasal Congestion   The history is provided by the patient. No language interpreter was used.   This chart was scribed for non-physician practitioner Santiago Glad, PA-C, working with Rolland Porter, MD, by Andrew Au, ED Scribe. This patient was seen in room TR08C/TR08C and the patient's care was started at 11:49 AM.  William Bass is a 39 y.o. male who presents to the Emergency Department complaining of unchanged nasal congestion that began 3 weeks ago.  Pt states he's had difficulty breathing out of his nose due to congestion. Pt reports associated productive cough.  He reports that he has felt feverish, but has not taken his temperature.  Pt has taken dayquil and Nyquil without relief.  Pt denies SOB, CP, and sinus pressure or tenderness.     Past Medical History  Diagnosis Date  . Heroin abuse   . Knee pain, left    History reviewed. No pertinent past surgical history. History reviewed. No pertinent family history. History  Substance Use Topics  . Smoking status: Former Games developer  . Smokeless tobacco: Not on file  . Alcohol Use: No    Review of Systems  HENT: Positive for congestion. Negative for sinus pressure and sore throat.   Respiratory: Positive for cough. Negative for shortness of breath.   Cardiovascular: Negative for chest pain.   Allergies  Ibuprofen  Home Medications   Prior to Admission medications   Medication Sig Start Date End Date Taking? Authorizing Provider  HYDROcodone-acetaminophen (NORCO/VICODIN) 5-325 MG per tablet Take 2 tablets by mouth every 4 (four) hours as needed for moderate pain or severe pain. 02/03/14   Earle Gell Cartner, PA-C  predniSONE (DELTASONE) 20 MG tablet Take 2 tablets (40 mg total) by mouth daily. Patient not taking: Reported on 02/03/2014 01/10/14   Dorthula Matas, PA-C    BP 122/81 mmHg  Pulse 76  Temp(Src) 97.9 F (36.6 C) (Oral)  Resp 17  Ht  (1.702 m)  Wt 180 lb (81.647 kg)  BMI 28.19 kg/m2  SpO2 99% Physical Exam  Constitutional: He is oriented to person, place, and time. He appears well-developed and well-nourished. No distress.  HENT:  Head: Normocephalic and atraumatic.  Right Ear: Tympanic membrane and ear canal normal.  Left Ear: Tympanic membrane and ear canal normal.  Nose: Nose normal. Right sinus exhibits no maxillary sinus tenderness and no frontal sinus tenderness. Left sinus exhibits no maxillary sinus tenderness and no frontal sinus tenderness.  Mouth/Throat: Oropharynx is clear and moist.  Eyes: Conjunctivae and EOM are normal.  Neck: Neck supple.  Cardiovascular: Normal rate, regular rhythm and normal heart sounds.  Exam reveals no gallop and no friction rub.   No murmur heard. Pulmonary/Chest: Effort normal and breath sounds normal. No respiratory distress. He has no wheezes. He has no rales.  Musculoskeletal: Normal range of motion.  Neurological: He is alert and oriented to person, place, and time.  Skin: Skin is warm and dry.  Psychiatric: He has a normal mood and affect. His behavior is normal.  Nursing note and vitals reviewed.   ED Course  Procedures (including critical care time) DIAGNOSTIC STUDIES: Oxygen Saturation is 99% on RA, normal by my interpretation.    COORDINATION OF CARE: 11:57 AM- Pt advised of plan for treatment and pt agrees.  Labs  Review Labs Reviewed - No data to display  Imaging Review Dg Chest 2 View  03/09/2014   CLINICAL DATA:  Three week history of cough, chest congestion fever and intermittent headaches. Former smoker.  EXAM: CHEST  2 VIEW  COMPARISON:  None.  FINDINGS: Cardiomediastinal silhouette unremarkable. Lungs clear. Bronchovascular markings normal. Pulmonary vascularity normal. No visible pleural effusions. No pneumothorax. Visualized bony thorax intact. Right nipple bar and  left nipple ring.  IMPRESSION: No acute cardiopulmonary disease.   Electronically Signed   By: Hulan Saashomas  Lawrence M.D.   On: 03/09/2014 13:34     EKG Interpretation None      MDM   Final diagnoses:  None   Pt CXR negative for acute infiltrate. Patients symptoms are consistent with URI.  Pt will be discharged with symptomatic treatment.  Verbalizes understanding and is agreeable with plan. Pt is hemodynamically stable & in NAD prior to dc.  Stable for discharge.  Return precautions given.   I personally performed the services described in this documentation, which was scribed in my presence. The recorded information has been reviewed and is accurate.    Santiago GladHeather Inmer Nix, PA-C 03/09/14 1405  Rolland PorterMark James, MD 03/11/14 [phone]

## 2014-03-09 NOTE — ED Notes (Signed)
Instructed patient not to use Afrin spray longer than 3 days per PA. Pt verbalized understanding.

## 2015-02-23 ENCOUNTER — Emergency Department (HOSPITAL_COMMUNITY)
Admission: EM | Admit: 2015-02-23 | Discharge: 2015-02-23 | Disposition: A | Discharge status: Home / Self Care | Payer: Managed Care, Other (non HMO) | Attending: Emergency Medicine | Admitting: Emergency Medicine

## 2015-02-23 ENCOUNTER — Encounter (HOSPITAL_COMMUNITY): Payer: Self-pay | Admitting: *Deleted

## 2015-02-23 DIAGNOSIS — Z87891 Personal history of nicotine dependence: Secondary | ICD-10-CM | POA: Diagnosis not present

## 2015-02-23 DIAGNOSIS — Z79899 Other long term (current) drug therapy: Secondary | ICD-10-CM | POA: Diagnosis not present

## 2015-02-23 DIAGNOSIS — L03012 Cellulitis of left finger: Secondary | ICD-10-CM

## 2015-02-23 DIAGNOSIS — M79642 Pain in left hand: Secondary | ICD-10-CM | POA: Diagnosis present

## 2015-02-23 MED ORDER — OXYCODONE-ACETAMINOPHEN 5-325 MG PO TABS
1.0000 | ORAL_TABLET | Freq: Once | ORAL | Status: AC
  Administered 2015-02-23: 1 via ORAL
  Filled 2015-02-23: qty 1

## 2015-02-23 MED ORDER — BACITRACIN ZINC 500 UNIT/GM EX OINT
TOPICAL_OINTMENT | Freq: Two times a day (BID) | CUTANEOUS | Status: DC
  Filled 2015-02-23: qty 0.9

## 2015-02-23 MED ORDER — CEPHALEXIN 500 MG PO CAPS
500.0000 mg | ORAL_CAPSULE | Freq: Four times a day (QID) | ORAL | Status: DC
Start: 2015-02-23 — End: 2016-10-23

## 2015-02-23 NOTE — Discharge Instructions (Signed)
Keep area clean and dry Return to ED for any new or worsening symptoms, any additional concerns Take all antibiotics until completion  Paronychia  Paronychia is an infection of the skin. It happens near a fingernail or toenail. It may cause pain and swelling around the nail. Usually, it is not serious and it clears up with treatment. HOME CARE  Soak the fingers or toes in warm water as told by your doctor. You may be told to do this for 20 minutes, 2-3 times a day.  Keep the area dry when you are not soaking it.  Take medicines only as told by your doctor.  If you were given an antibiotic medicine, finish all of it even if you start to feel better.  Keep the affected area clean.  Do not try to drain a fluid-filled bump yourself.  Wear rubber gloves when putting your hands in water.  Wear gloves if your hands might touch cleaners or chemicals.  Follow your doctor's instructions about:  Wound care.  Bandage (dressing) changes and removal. GET HELP IF:  Your symptoms get worse or do not improve.  You have a fever or chills.  You have redness spreading from the affected area.  You have more fluid, blood, or pus coming from the affected area.  Your finger or knuckle is swollen or is hard to move.   This information is not intended to replace advice given to you by your health care provider. Make sure you discuss any questions you have with your health care provider.   Document Released: 01/05/2009 Document Revised: 06/03/2014 Document Reviewed: 12/25/2013 Elsevier Interactive Patient Education [employer]

## 2015-02-23 NOTE — ED Provider Notes (Signed)
CSN: [id number]     Arrival date & time 02/23/15  1802 History  By signing my name below, I, Gonzella Lex, attest that this documentation has been prepared under the direction and in the presence of [facility], PA-C. Electronically Signed: Gonzella Lex, Scribe. 02/23/2015. 6:29 PM.     Chief Complaint  Patient presents with  . Finger Injury    The history is provided by the patient. No language interpreter was used.   HPI Comments: William Bass is a 40 y.o. male who presents to the Emergency Department complaining of sudden onset, constant, throbbing pain to his left second digit for the past three days. He notes associated edema and a greenish appearance. 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 Pt denies drainage, fever, abdominal pain and chills. He also denies allergies to antibiotics.  Past Medical History  Diagnosis Date  . Heroin abuse   . Knee pain, left    History reviewed. No pertinent past surgical history. History reviewed. No pertinent family history. Social History  Substance Use Topics  . Smoking status: Former Games developer  . Smokeless tobacco: None  . Alcohol Use: No    Review of Systems  Constitutional: Negative for fever and chills.  Gastrointestinal: Negative for abdominal pain.  Musculoskeletal: Positive for joint swelling.       Edema to the left second digit Finger pain  Skin: Positive for color change.   Allergies  Ibuprofen  Home Medications   Prior to Admission medications   Medication Sig Start Date End Date Taking? Authorizing Provider  benzonatate (TESSALON) 100 MG capsule Take 1 capsule (100 mg total) by mouth every 8 (eight) hours. 03/09/14   Heather Laisure, PA-C  cephALEXin (KEFLEX) 500 MG capsule Take 1 capsule (500 mg total) by mouth 4 (four) times daily. 02/23/15   Chase Picket Ward, PA-C  HYDROcodone-acetaminophen (NORCO/VICODIN) 5-325 MG per tablet Take 2 tablets by  mouth every 4 (four) hours as needed for moderate pain or severe pain. 02/03/14   Joycie Peek, PA-C  oxymetazoline (AFRIN NASAL SPRAY) 0.05 % nasal spray Place 1 spray into both nostrils 2 (two) times daily. 03/09/14   Heather Laisure, PA-C  predniSONE (DELTASONE) 20 MG tablet Take 2 tablets (40 mg total) by mouth daily. Patient not taking: Reported on 02/03/2014 01/10/14   Marlon Pel, PA-C   BP 104/60 mmHg  Pulse 80  Temp(Src) 98.4 F (36.9 C) (Oral)  Resp 20  SpO2 99% Physical Exam  Constitutional: He is oriented to person, place, and time. He appears well-developed and well-nourished. No distress.  HENT:  Head: Normocephalic and atraumatic.  Eyes: Conjunctivae are normal.  Neck: No tracheal deviation present.  Cardiovascular: Normal rate, regular rhythm and normal heart sounds.   Pulmonary/Chest: Effort normal and breath sounds normal. No respiratory distress. He has no wheezes. He has no rales.  Abdominal: Soft. He exhibits no distension.  Musculoskeletal: Normal range of motion. He exhibits tenderness.  Neurological: He is alert and oriented to person, place, and time.  Left upper extremity is neurovascularly intact  Skin: Skin is warm and dry.  Index finger with 1 cm erythema, swelling. Lateral nailbed yellow/green in color.   Psychiatric: He has a normal mood and affect.  Nursing note and vitals reviewed.   ED Course  Procedures  DIAGNOSTIC STUDIES:    Oxygen Saturation is 99% on RA, normal by my interpretation.  COORDINATION OF CARE:  6:21 PM  Will preform I&D procedure on pt's finger. Discussed treatment plan with pt at bedside and pt agreed to plan.    INCISION AND DRAINAGE PROCEDURE NOTE: Patient identification was confirmed and verbal consent was obtained. This procedure was performed by Elizabeth Sauer, PA-C at 7:46 PM. Site: Lateral nailbed of left index finger Sterile procedures observed Needle size: 18G Anesthetic used (type and amt): None Drainage: small  amount of purulent discharge. Complexity: Simple Site anesthetized, incision made over site with 18G needed, wound drained, no foreign bodies seen, rinsed with normal saline, covered with bacitracin and sterile dressing.  Pt tolerated procedure well without complications.  Instructions for care discussed verbally and pt provided with additional written instructions for homecare and f/u.   MDM   Final diagnoses:  Paronychia of finger, left   Merric Yost presents with paronychia of left index finger. 18G needle used to drain site - purulent discharge expressed. Will treat with keflex due to surrounding erythema present. Home care instructions given. Return precautions given.   I personally performed the services described in this documentation, which was scribed in my presence. The recorded information has been reviewed and is accurate.  [facility] Ward, PA-C 02/23/15 1951  Vanetta Mulders, MD 02/24/15 [phone]

## 2015-02-23 NOTE — ED Notes (Signed)
Pt reports infection to LT index finger.

## 2015-03-25 ENCOUNTER — Emergency Department (HOSPITAL_COMMUNITY)
Admission: EM | Admit: 2015-03-25 | Discharge: 2015-03-25 | Disposition: A | Discharge status: Home / Self Care | Payer: Managed Care, Other (non HMO) | Attending: Emergency Medicine | Admitting: Emergency Medicine

## 2015-03-25 ENCOUNTER — Encounter (HOSPITAL_COMMUNITY): Payer: Self-pay | Admitting: Emergency Medicine

## 2015-03-25 ENCOUNTER — Emergency Department (HOSPITAL_COMMUNITY): Payer: Managed Care, Other (non HMO)

## 2015-03-25 DIAGNOSIS — Y998 Other external cause status: Secondary | ICD-10-CM | POA: Insufficient documentation

## 2015-03-25 DIAGNOSIS — Z87891 Personal history of nicotine dependence: Secondary | ICD-10-CM | POA: Insufficient documentation

## 2015-03-25 DIAGNOSIS — S99921A Unspecified injury of right foot, initial encounter: Secondary | ICD-10-CM | POA: Diagnosis not present

## 2015-03-25 DIAGNOSIS — W1789XA Other fall from one level to another, initial encounter: Secondary | ICD-10-CM | POA: Insufficient documentation

## 2015-03-25 DIAGNOSIS — S6992XA Unspecified injury of left wrist, hand and finger(s), initial encounter: Secondary | ICD-10-CM | POA: Insufficient documentation

## 2015-03-25 DIAGNOSIS — Y92009 Unspecified place in unspecified non-institutional (private) residence as the place of occurrence of the external cause: Secondary | ICD-10-CM | POA: Insufficient documentation

## 2015-03-25 DIAGNOSIS — Y9389 Activity, other specified: Secondary | ICD-10-CM | POA: Insufficient documentation

## 2015-03-25 DIAGNOSIS — S99922A Unspecified injury of left foot, initial encounter: Secondary | ICD-10-CM | POA: Diagnosis not present

## 2015-03-25 DIAGNOSIS — M79671 Pain in right foot: Secondary | ICD-10-CM

## 2015-03-25 DIAGNOSIS — M25532 Pain in left wrist: Secondary | ICD-10-CM

## 2015-03-25 DIAGNOSIS — M79672 Pain in left foot: Secondary | ICD-10-CM

## 2015-03-25 MED ORDER — ACETAMINOPHEN 325 MG PO TABS
650.0000 mg | ORAL_TABLET | Freq: Once | ORAL | Status: AC
  Administered 2015-03-25: 650 mg via ORAL
  Filled 2015-03-25: qty 2

## 2015-03-25 NOTE — Discharge Instructions (Signed)
Use over-the-counter pain relievers such as Tylenol or Motrin for your pain relief. Keep your feet and wrist elevated and apply ice for pain and swelling. Follow-up with your PCP if your symptoms persist.   Wrist Pain There are many things that can cause wrist pain. Some common causes include:  An injury to the wrist area, such as a sprain, strain, or fracture.  Overuse of the joint.  A condition that causes increased pressure on a nerve in the wrist (carpal tunnel syndrome).  Wear and tear of the joints that occurs with aging (osteoarthritis).  A variety of other types of arthritis. Sometimes, the cause of wrist pain is not known. The pain often goes away when you follow your health care provider's instructions for relieving pain at home. If your wrist pain continues, tests may need to be done to diagnose your condition. HOME CARE INSTRUCTIONS Pay attention to any changes in your symptoms. Take these actions to help with your pain:  Rest the wrist area for at least 48 hours or as told by your health care provider.  If directed, apply ice to the injured area:  Put ice in a plastic bag.  Place a towel between your skin and the bag.  Leave the ice on for 20 minutes, 2-3 times per day.  Keep your arm raised (elevated) above the level of your heart while you are sitting or lying down.  If a splint or elastic bandage has been applied, use it as told by your health care provider.  Remove the splint or bandage only as told by your health care provider.  Loosen the splint or bandage if your fingers become numb or have a tingling feeling, or if they turn cold or blue.  Take over-the-counter and prescription medicines only as told by your health care provider.  Keep all follow-up visits as told by your health care provider. This is important. SEEK MEDICAL CARE IF:  Your pain is not helped by treatment.  Your pain gets worse. SEEK IMMEDIATE MEDICAL CARE IF:  Your fingers become  swollen.  Your fingers turn white, very red, or cold and blue.  Your fingers are numb or have a tingling feeling.  You have difficulty moving your fingers.   This information is not intended to replace advice given to you by your health care provider. Make sure you discuss any questions you have with your health care provider.   Document Released: 10/27/2004 Document Revised: 10/08/2014 Document Reviewed: 06/04/2014 Elsevier Interactive Patient Education 2016 [employer]  Musculoskeletal Pain Musculoskeletal pain is muscle and boney aches and pains. These pains can occur in any part of the body. Your caregiver may treat you without knowing the cause of the pain. They may treat you if blood or urine tests, X-rays, and other tests were normal.  CAUSES There is often not a definite cause or reason for these pains. These pains may be caused by a type of germ (virus). The discomfort may also come from overuse. Overuse includes working out too hard when your body is not fit. Boney aches also come from weather changes. Bone is sensitive to atmospheric pressure changes. HOME CARE INSTRUCTIONS   Ask when your test results will be ready. Make sure you get your test results.  Only take over-the-counter or prescription medicines for pain, discomfort, or fever as directed by your caregiver. If you were given medications for your condition, do not drive, operate machinery or power tools, or sign legal documents for 24 hours. Do  not drink alcohol. Do not take sleeping pills or other medications that may interfere with treatment.  Continue all activities unless the activities cause more pain. When the pain lessens, slowly resume normal activities. Gradually increase the intensity and duration of the activities or exercise.  During periods of severe pain, bed rest may be helpful. Lay or sit in any position that is comfortable.  Putting ice on the injured area.  Put ice in a bag.  Place a towel  between your skin and the bag.  Leave the ice on for 15 to 20 minutes, 3 to 4 times a day.  Follow up with your caregiver for continued problems and no reason can be found for the pain. If the pain becomes worse or does not go away, it may be necessary to repeat tests or do additional testing. Your caregiver may need to look further for a possible cause. SEEK IMMEDIATE MEDICAL CARE IF:  You have pain that is getting worse and is not relieved by medications.  You develop chest pain that is associated with shortness or breath, sweating, feeling sick to your stomach (nauseous), or throw up (vomit).  Your pain becomes localized to the abdomen.  You develop any new symptoms that seem different or that concern you. MAKE SURE YOU:   Understand these instructions.  Will watch your condition.  Will get help right away if you are not doing well or get worse.   This information is not intended to replace advice given to you by your health care provider. Make sure you discuss any questions you have with your health care provider.   Document Released: 01/17/2005 Document Revised: 04/11/2011 Document Reviewed: 09/21/2012 Elsevier Interactive Patient Education [employer]

## 2015-03-25 NOTE — ED Notes (Signed)
Pt. fell from  roof to deck at home today ( approx. 10') denies LOC /ambulatory , reports bilateral feet pain and left wrist pain .

## 2015-03-25 NOTE — ED Notes (Signed)
Pt stable, ambulatory, states understanding of discharge instructions 

## 2015-03-25 NOTE — ED Provider Notes (Signed)
CSN: [id number]     Arrival date & time 03/25/15  2014 History  By signing my name below, I, Budd Palmer, attest that this documentation has been prepared under the direction and in the presence of Derek Laughter, PA-C. Electronically Signed: Budd Palmer, ED Scribe. 03/25/2015. 9:57 PM.    Chief Complaint  Patient presents with  . Fall   The history is provided by the patient. No language interpreter was used.   HPI Comments: William Bass is a 40 y.o. male former smoker who presents to the Emergency Department complaining of a fall that occurred PTA. Pt states he slipped and fell off of his roof and onto a concrete patio, landing on his feet. He notes he was wearing work boots at the time and was unable to roll away, taking the brunt of the fall directly on his feet. He did not strike his head, lose consciousness or fall onto his back with the fall. He states he must have struck his left wrist on the way down as well, as it is also painful. He reports associated bilateral ankle swelling and pain towards the lateral ankle and heels, as well as left wrist pain and swelling. His ankle pains are moderate but he is still able to walk. He states that if he leans towards his toes he can walk with a steady gait. He notes exacerbation of the wrist pain with movement, and alleviation of the pain with keeping the hand still. He has taken tylenol with moderate relief. He denies numbness, tingling, weakness, limited range of motion or loss of sensation in the extremities. He denies all other pain complaints.  Past Medical History  Diagnosis Date  . Heroin abuse   . Knee pain, left    History reviewed. No pertinent past surgical history. No family history on file. Social History  Substance Use Topics  . Smoking status: Former Games developer  . Smokeless tobacco: None  . Alcohol Use: No    Review of Systems  Musculoskeletal: Positive for joint swelling and arthralgias.  All other systems reviewed and are  negative.   Allergies  Ibuprofen  Home Medications   Prior to Admission medications   Medication Sig Start Date End Date Taking? Authorizing Provider  benzonatate (TESSALON) 100 MG capsule Take 1 capsule (100 mg total) by mouth every 8 (eight) hours. 03/09/14   Heather Laisure, PA-C  cephALEXin (KEFLEX) 500 MG capsule Take 1 capsule (500 mg total) by mouth 4 (four) times daily. 02/23/15   Chase Picket Ward, PA-C  HYDROcodone-acetaminophen (NORCO/VICODIN) 5-325 MG per tablet Take 2 tablets by mouth every 4 (four) hours as needed for moderate pain or severe pain. 02/03/14   Joycie Peek, PA-C  oxymetazoline (AFRIN NASAL SPRAY) 0.05 % nasal spray Place 1 spray into both nostrils 2 (two) times daily. 03/09/14   Heather Laisure, PA-C  predniSONE (DELTASONE) 20 MG tablet Take 2 tablets (40 mg total) by mouth daily. Patient not taking: Reported on 02/03/2014 01/10/14   Marlon Pel, PA-C   BP 131/93 mmHg  Pulse 98  Temp(Src) 99.1 F (37.3 C) (Oral)  Resp 16  Ht  (1.702 m)  Wt 79.379 kg  BMI 27.40 kg/m2  SpO2 98% Physical Exam  Constitutional: He is oriented to person, place, and time. He appears well-developed and well-nourished.  HENT:  Head: Normocephalic and atraumatic.  Eyes: Conjunctivae are normal. Right eye exhibits no discharge. Left eye exhibits no discharge.  Cardiovascular: Normal rate and intact distal pulses.   Radial and  pedal pulses palpable. Cap refill less than 3 seconds.  Pulmonary/Chest: Effort normal. No respiratory distress.  Musculoskeletal: Normal range of motion.       Left wrist: He exhibits tenderness. He exhibits normal range of motion, no swelling and no deformity.       Right foot: There is tenderness. There is normal range of motion, no swelling, normal capillary refill and no deformity.       Left foot: There is tenderness. There is normal range of motion, no swelling, normal capillary refill and no deformity.       Feet:  Mild generalized tenderness  of the left and right heels as indicated in the diagram. Patient retains full range of motion at the ankle and toes. He is able to cannulate in the emergency department with a steady gait. No obvious swelling at the ankles. No overlying skin changes, erythema or ecchymosis noted. No bony deformities. Mild tenderness over the dorsal aspect of the left wrist. No swelling noted. Full range of motion intact. Normal grip strength. No overlying skin changes, erythema or ecchymosis. Moves remaining extremities without pain. All joints are supple without deformity.  Neurological: He is alert and oriented to person, place, and time. Coordination normal.  5/5 strength in all extremities. Sensation to light touch intact throughout.  Skin: Skin is warm and dry. No rash noted. He is not diaphoretic. No erythema.  Psychiatric: He has a normal mood and affect.  Nursing note and vitals reviewed.   ED Course  Procedures  DIAGNOSTIC STUDIES: Oxygen Saturation is 98% on RA, normal by my interpretation.    COORDINATION OF CARE: 9:50 PM - Discussed normal XR results and advised to elevate and ice the feet. Will order a brace for the wrist. Pt advised of plan for treatment and pt agrees.  Labs Review Labs Reviewed - No data to display  Imaging Review Dg Wrist Complete Left  03/25/2015  CLINICAL DATA:  Larey Seat 10 feet from roof landing on his feet. Wrist pain. EXAM: LEFT WRIST - COMPLETE 3+ VIEW COMPARISON:  None. FINDINGS: There is no evidence of fracture or dislocation. There is no evidence of arthropathy or other focal bone abnormality. Soft tissues are unremarkable. IMPRESSION: Negative. Electronically Signed   By: Kennith Center M.D.   On: 03/25/2015 21:39   Dg Foot Complete Left  03/25/2015  CLINICAL DATA:  Fall 10 feet from roof with foot pain, initial encounter EXAM: LEFT FOOT - COMPLETE 3+ VIEW COMPARISON:  None. FINDINGS: There is no evidence of fracture or dislocation. There is no evidence of arthropathy or  other focal bone abnormality. Soft tissues are unremarkable. IMPRESSION: No acute abnormality noted. Electronically Signed   By: Alcide Clever M.D.   On: 03/25/2015 21:38   Dg Foot Complete Right  03/25/2015  CLINICAL DATA:  Fall 10 feet from roof with right foot pain, initial encounter EXAM: RIGHT FOOT COMPLETE - 3+ VIEW COMPARISON:  None. FINDINGS: There is no evidence of fracture or dislocation. There is no evidence of arthropathy or other focal bone abnormality. Soft tissues are unremarkable. IMPRESSION: No acute abnormality noted. Electronically Signed   By: Alcide Clever M.D.   On: 03/25/2015 21:40   I have personally reviewed and evaluated these images and lab results as part of my medical decision-making.   EKG Interpretation None      MDM   Final diagnoses:  Bilateral foot pain  Left wrist pain   40 year old male presenting with bilateral foot pain and left wrist  pain after a fall from a roof to a deck. He landed on both feet and fell onto his left wrist. He denies head injury or back injury. Vital signs stable. Patient is nontoxic-appearing in no acute distress. Generalized tenderness over the bilateral heels. Lower extremities are neurovascularly intact with full range of motion. Patient is witnessed ambulating in the emergency department with no distress. Mild tenderness over the dorsal aspect of the left wrist. Left hand is neurovascularly intact with full range of motion. All joints are supple without deformity. X-rays of feet and wrist are negative. Pain treated in emergency part with Tylenol. Will provide wrist splint for comfort. Discussed Rice therapy for his arthralgias. Patient is to follow-up with his PCP if symptoms do not improve. Return precautions given in discharge paperwork and discussed with pt at bedside. Pt stable for discharge  I personally performed the services described in this documentation, which was scribed in my presence. The recorded information has been  reviewed and is accurate.   Alveta Heimlich, PA-C 03/25/15 2257  Laurence Spates, MD 03/25/15 [phone]

## 2015-08-03 ENCOUNTER — Encounter (HOSPITAL_COMMUNITY): Payer: Self-pay | Admitting: Family Medicine

## 2015-08-03 ENCOUNTER — Emergency Department (HOSPITAL_COMMUNITY)
Admission: EM | Admit: 2015-08-03 | Discharge: 2015-08-03 | Disposition: A | Discharge status: Home / Self Care | Payer: Managed Care, Other (non HMO) | Attending: Emergency Medicine | Admitting: Emergency Medicine

## 2015-08-03 DIAGNOSIS — Z87891 Personal history of nicotine dependence: Secondary | ICD-10-CM | POA: Insufficient documentation

## 2015-08-03 DIAGNOSIS — Z79899 Other long term (current) drug therapy: Secondary | ICD-10-CM | POA: Insufficient documentation

## 2015-08-03 DIAGNOSIS — B029 Zoster without complications: Secondary | ICD-10-CM

## 2015-08-03 MED ORDER — ACYCLOVIR 400 MG PO TABS: 1000.0000 mg | ORAL_TABLET | Freq: Three times a day (TID) | ORAL | Status: DC

## 2015-08-03 MED ORDER — HYDROXYZINE HCL 25 MG PO TABS: 25.0000 mg | ORAL_TABLET | Freq: Four times a day (QID) | ORAL | Status: DC

## 2015-08-03 MED ORDER — PREDNISONE 10 MG (21) PO TBPK: 20.0000 mg | ORAL_TABLET | Freq: Two times a day (BID) | ORAL | Status: DC

## 2015-08-03 NOTE — ED Notes (Signed)
Patient verbalized understanding of discharge instructions and denies any further needs or questions at this time. VS stable. Patient ambulatory with steady gait, pt declined wheelchair, RN escorted to ED entrance. 

## 2015-08-03 NOTE — ED Notes (Signed)
PA-C to see and assess patient before RN assessment. See PA note. 

## 2015-08-03 NOTE — ED Notes (Signed)
Pt here for rash to left upper thigh, hip area. sts he believes it is scabies.

## 2015-08-03 NOTE — ED Provider Notes (Signed)
CSN: [id number]     Arrival date & time 08/03/15  1523 History   None    Chief Complaint  Patient presents with  . Rash     (Consider location/radiation/quality/duration/timing/severity/associated sxs/prior Treatment) Patient is a 40 y.o. male presenting with rash. The history is provided by the patient.  Rash Location:  Ano-genital Ano-genital rash location:  L buttock Quality: blistering, burning, itchiness and painful   Pain details:    Quality:  Burning   Severity:  Severe   Onset quality:  Gradual   Duration:  1 week   Timing:  Constant   Progression:  Worsening Chronicity:  New Relieved by:  Nothing Worsened by:  Contact and heat Ineffective treatments:  Topical steroids  William Bass is a 40 y.o. male who presents to the ED with left upper thigh and hip pain that started about a week ago as just a burning sensation to the area. Later he began to see lesions appear and the pain increased.   Past Medical History  Diagnosis Date  . Heroin abuse   . Knee pain, left    History reviewed. No pertinent past surgical history. History reviewed. No pertinent family history. Social History  Substance Use Topics  . Smoking status: Former Games developermoker  . Smokeless tobacco: None  . Alcohol Use: No    Review of Systems  Musculoskeletal:       Pain  Skin: Positive for rash.  all other systems negative    Allergies  Ibuprofen  Home Medications   Prior to Admission medications   Medication Sig Start Date End Date Taking? Authorizing Provider  acyclovir (ZOVIRAX) 400 MG tablet Take 2.5 tablets (1,000 mg total) by mouth 3 (three) times daily. 08/03/15   Mclean Moya Orlene OchM Nahjae Hoeg, NP  benzonatate (TESSALON) 100 MG capsule Take 1 capsule (100 mg total) by mouth every 8 (eight) hours. 03/09/14   Heather Laisure, PA-C  cephALEXin (KEFLEX) 500 MG capsule Take 1 capsule (500 mg total) by mouth 4 (four) times daily. 02/23/15   Chase PicketJaime Pilcher Ward, PA-C  HYDROcodone-acetaminophen (NORCO/VICODIN) 5-325  MG per tablet Take 2 tablets by mouth every 4 (four) hours as needed for moderate pain or severe pain. 02/03/14   Joycie PeekBenjamin Cartner, PA-C  hydrOXYzine (ATARAX/VISTARIL) 25 MG tablet Take 1 tablet (25 mg total) by mouth every 6 (six) hours. 08/03/15   Diedre Maclellan Orlene OchM Malahki Gasaway, NP  oxymetazoline (AFRIN NASAL SPRAY) 0.05 % nasal spray Place 1 spray into both nostrils 2 (two) times daily. 03/09/14   Heather Laisure, PA-C  predniSONE (STERAPRED UNI-PAK 21 TAB) 10 MG (21) TBPK tablet Take 2 tablets (20 mg total) by mouth 2 (two) times daily. 08/03/15   Manasseh Pittsley Orlene OchM Marlisa Caridi, NP   BP 124/68 mmHg  Pulse 76  Temp(Src) 98.6 F (37 C) (Oral)  Resp 18  SpO2 100% Physical Exam  Constitutional: He is oriented to person, place, and time. He appears well-developed and well-nourished. No distress.  HENT:  Head: Normocephalic and atraumatic.  Eyes: EOM are normal.  Neck: Normal range of motion. Neck supple.  Cardiovascular: Normal rate.   Pulmonary/Chest: Effort normal. No respiratory distress.  Abdominal: He exhibits no distension.  Musculoskeletal: Normal range of motion. He exhibits no edema.       Back:  Vesicular lesions that are tender on exam c/w zoster.   Neurological: He is alert and oriented to person, place, and time. No cranial nerve deficit.  Skin: Skin is warm and dry.  Psychiatric: He has a normal mood and affect.  His behavior is normal.  Nursing note and vitals reviewed.   ED Course  Procedures (including critical care time) Labs Review Labs Reviewed - No data to display  Imaging Review  MDM  40 y.o. male with pain and blister area to the S5 dermatome stable for d/c without complications at this time. Will start antiviral, steroids, and Vistaril for itching. Patient declined narcotic as he is a recovering abuser. Discussed with the patient and all questioned fully answered. He will return if any problems arise.  Final diagnoses:  Zoster        Janne NapoleonHope M Alwaleed Obeso, NP 08/04/15 2012  Marily MemosJason Mesner,  MD 08/05/15 [phone]

## 2015-08-03 NOTE — Discharge Instructions (Signed)

## 2015-09-29 ENCOUNTER — Emergency Department (HOSPITAL_COMMUNITY)
Admission: EM | Admit: 2015-09-29 | Discharge: 2015-09-29 | Disposition: A | Discharge status: Home / Self Care | Payer: Managed Care, Other (non HMO) | Attending: Emergency Medicine | Admitting: Emergency Medicine

## 2015-09-29 ENCOUNTER — Encounter (HOSPITAL_COMMUNITY): Payer: Self-pay

## 2015-09-29 DIAGNOSIS — Z79899 Other long term (current) drug therapy: Secondary | ICD-10-CM | POA: Insufficient documentation

## 2015-09-29 DIAGNOSIS — Z87891 Personal history of nicotine dependence: Secondary | ICD-10-CM | POA: Insufficient documentation

## 2015-09-29 DIAGNOSIS — F1910 Other psychoactive substance abuse, uncomplicated: Secondary | ICD-10-CM

## 2015-09-29 LAB — RAPID URINE DRUG SCREEN, HOSP PERFORMED
Amphetamines: NOT DETECTED
Barbiturates: NOT DETECTED
Benzodiazepines: NOT DETECTED
Cocaine: POSITIVE — AB
Opiates: POSITIVE — AB
Tetrahydrocannabinol: POSITIVE — AB

## 2015-09-29 LAB — CBC WITH DIFFERENTIAL/PLATELET
Basophils Absolute: 0 10*3/uL (ref 0.0–0.1)
Basophils Relative: 1 %
EOS ABS: 0.4 10*3/uL (ref 0.0–0.7)
Eosinophils Relative: 7 %
HCT: 46.1 % (ref 39.0–52.0)
HEMOGLOBIN: 15 g/dL (ref 13.0–17.0)
LYMPHS ABS: 1.1 10*3/uL (ref 0.7–4.0)
Lymphocytes Relative: 18 %
MCH: 30.2 pg (ref 26.0–34.0)
MCHC: 32.5 g/dL (ref 30.0–36.0)
MCV: 92.8 fL (ref 78.0–100.0)
MONO ABS: 0.5 10*3/uL (ref 0.1–1.0)
MONOS PCT: 9 %
NEUTROS PCT: 65 %
Neutro Abs: 4 10*3/uL (ref 1.7–7.7)
Platelets: 207 10*3/uL (ref 150–400)
RBC: 4.97 MIL/uL (ref 4.22–5.81)
RDW: 12.6 % (ref 11.5–15.5)
WBC: 6.1 10*3/uL (ref 4.0–10.5)

## 2015-09-29 LAB — COMPREHENSIVE METABOLIC PANEL
ALT: 16 U/L — ABNORMAL LOW (ref 17–63)
AST: 16 U/L (ref 15–41)
Albumin: 4.3 g/dL (ref 3.5–5.0)
Alkaline Phosphatase: 62 U/L (ref 38–126)
Anion gap: 7 (ref 5–15)
BUN: 11 mg/dL (ref 6–20)
CO2: 26 mmol/L (ref 22–32)
Calcium: 9.5 mg/dL (ref 8.9–10.3)
Chloride: 104 mmol/L (ref 101–111)
Creatinine, Ser: 0.96 mg/dL (ref 0.61–1.24)
GFR calc Af Amer: >60 mL/min (ref >60)
GFR calc non Af Amer: >60 mL/min (ref >60)
Glucose, Bld: 91 mg/dL (ref 65–99)
Potassium: 4.1 mmol/L (ref 3.5–5.1)
Sodium: 137 mmol/L (ref 135–145)
Total Bilirubin: 0.6 mg/dL (ref 0.3–1.2)
Total Protein: 6.9 g/dL (ref 6.5–8.1)

## 2015-09-29 LAB — ETHANOL: Alcohol, Ethyl (B): <5 mg/dL (ref <5)

## 2015-09-29 MED ORDER — DICYCLOMINE HCL 10 MG PO CAPS
10.0000 mg | ORAL_CAPSULE | Freq: Once | ORAL | Status: AC
  Administered 2015-09-29: 10 mg via ORAL
  Filled 2015-09-29: qty 1

## 2015-09-29 MED ORDER — ONDANSETRON 4 MG PO TBDP
4.0000 mg | ORAL_TABLET | Freq: Once | ORAL | Status: AC
  Administered 2015-09-29: 4 mg via ORAL
  Filled 2015-09-29: qty 1

## 2015-09-29 MED ORDER — DICYCLOMINE HCL 20 MG PO TABS: 20.0000 mg | ORAL_TABLET | Freq: Four times a day (QID) | ORAL | 0 refills | Status: DC | PRN

## 2015-09-29 MED ORDER — ONDANSETRON HCL 4 MG PO TABS: 4.0000 mg | ORAL_TABLET | Freq: Four times a day (QID) | ORAL | 0 refills | Status: DC

## 2015-09-29 MED ORDER — CLONIDINE HCL 0.1 MG PO TABS
0.1000 mg | ORAL_TABLET | Freq: Once | ORAL | Status: AC
  Administered 2015-09-29: 0.1 mg via ORAL
  Filled 2015-09-29: qty 1

## 2015-09-29 MED ORDER — CLONIDINE HCL 0.2 MG PO TABS: 0.2000 mg | ORAL_TABLET | Freq: Three times a day (TID) | ORAL | 0 refills | Status: DC

## 2015-09-29 NOTE — ED Provider Notes (Signed)
[facility]EM[facility]Y DEPT Provider Note   CSN: [id number] Arrival date & time: 09/29/15  1444  By signing my name below, I, Rosario Adie, attest that this documentation has been prepared under the direction and in the presence of Raeford Razor, MD. Electronically Signed: Rosario Adie, ED Scribe. 09/29/15. 8:02 PM.  History   Chief Complaint Chief Complaint  Patient presents with  . Medical Clearance   The history is provided by the patient. No language interpreter was used.   HPI Comments: William Bass is a 40 y.o. male with a PMHx of heronie abuse, who presents to the Emergency Department requesting entrance into a detox program. He states that he routinely has been using cocaine, heroine, and marijuana "for a while". No EtOH usage. Pt last used all three last night. Pt reports that he is experiencing diaphoresis, fatigue, chills, and diarrhea since his last usage. Pt states that he has been using for years, but is trying to detox. Pt has been through rehab in the past, with mild success, but notes that he got involved with old friends who got him back into drugs. His last entrance into rehab was ~5 years ago. He notes an allergy to Ibuprofen, but none otherwise. Denies SI, HI.   Past Medical History:  Diagnosis Date  . Heroin abuse   . Knee pain, left    Patient Active Problem List   Diagnosis Date Noted  . KNEE PAIN, LEFT 08/10/2007  . OVERWEIGHT 02/23/2007   History reviewed. No pertinent surgical history.  Home Medications    Prior to Admission medications   Medication Sig Start Date End Date Taking? Authorizing Provider  acyclovir (ZOVIRAX) 400 MG tablet Take 2.5 tablets (1,000 mg total) by mouth 3 (three) times daily. 08/03/15   Hope Orlene Och, NP  benzonatate (TESSALON) 100 MG capsule Take 1 capsule (100 mg total) by mouth every 8 (eight) hours. 03/09/14   Heather Laisure, PA-C  cephALEXin (KEFLEX) 500 MG capsule Take 1 capsule (500 mg total) by mouth 4 (four) times  daily. 02/23/15   Chase Picket Ward, PA-C  HYDROcodone-acetaminophen (NORCO/VICODIN) 5-325 MG per tablet Take 2 tablets by mouth every 4 (four) hours as needed for moderate pain or severe pain. 02/03/14   Joycie Peek, PA-C  hydrOXYzine (ATARAX/VISTARIL) 25 MG tablet Take 1 tablet (25 mg total) by mouth every 6 (six) hours. 08/03/15   Hope Orlene Och, NP  oxymetazoline (AFRIN NASAL SPRAY) 0.05 % nasal spray Place 1 spray into both nostrils 2 (two) times daily. 03/09/14   Heather Laisure, PA-C  predniSONE (STERAPRED UNI-PAK 21 TAB) 10 MG (21) TBPK tablet Take 2 tablets (20 mg total) by mouth 2 (two) times daily. 08/03/15   Hope Orlene Och, NP   Family History No family history on file.  Social History Social History  Substance Use Topics  . Smoking status: Former Games developer  . Smokeless tobacco: Never Used  . Alcohol use No   Allergies   Ibuprofen  Review of Systems Review of Systems  Constitutional: Positive for chills, diaphoresis and fatigue.  Gastrointestinal: Positive for diarrhea.  Psychiatric/Behavioral: Negative for suicidal ideas.  All other systems reviewed and are negative.  Physical Exam Updated Vital Signs BP 120/81   Pulse 62   Temp 97.8 F (36.6 C)   Resp 18   SpO2 100%   Physical Exam  Constitutional: He appears well-developed and well-nourished. No distress.  HENT:  Head: Normocephalic.  Eyes: Conjunctivae are normal. Right eye exhibits no discharge. Left eye exhibits  no discharge. No scleral icterus.  Neck: Normal range of motion. Neck supple. No JVD present. No thyromegaly present.  Cardiovascular: Normal rate, regular rhythm and normal heart sounds.  Exam reveals no gallop and no friction rub.   No murmur heard. Pulmonary/Chest: Effort normal and breath sounds normal. No respiratory distress. He has no wheezes. He has no rales.  Abdominal: He exhibits no distension.  Musculoskeletal: Normal range of motion. He exhibits no edema or tenderness.  Lymphadenopathy:     He has no cervical adenopathy.  Neurological: He is alert. Coordination normal.  Skin: Skin is warm and dry. No rash noted. No erythema.  Psychiatric: He has a normal mood and affect. His behavior is normal.  Nursing note and vitals reviewed.  ED Treatments / Results  DIAGNOSTIC STUDIES: Oxygen Saturation is 100% on RA, normal by my interpretation.   COORDINATION OF CARE: 8:01 PM-Discussed next steps with pt. Pt verbalized understanding and is agreeable with the plan.   Labs (all labs ordered are listed, but only abnormal results are displayed) Labs Reviewed  COMPREHENSIVE METABOLIC PANEL - Abnormal; Notable for the following:       Result Value   ALT 16 (*)    All other components within normal limits  URINE RAPID DRUG SCREEN, HOSP PERFORMED - Abnormal; Notable for the following:    Opiates POSITIVE (*)    Cocaine POSITIVE (*)    Tetrahydrocannabinol POSITIVE (*)    All other components within normal limits  ETHANOL  CBC WITH DIFFERENTIAL/PLATELET   Procedures Procedures (including critical care time)  Medications Ordered in ED Medications - No data to display  Initial Impression / Assessment and Plan / ED Course  I have reviewed the triage vital signs and the nursing notes.  Pertinent labs & imaging results that were available during my care of the patient were reviewed by me and considered in my medical decision making (see chart for details).  Clinical Course   40 year old male with polysubstance abuse. He is not suicidal or homicidal. He is not psychotic. Will treat symptoms of withdrawal symptomatically and give him outpatient referrals.   Final Clinical Impressions(s) / ED Diagnoses   Final diagnoses:  Polysubstance abuse    New Prescriptions Discharge Medication List as of 09/29/2015  8:13 PM    START taking these medications   Details  cloNIDine (CATAPRES) 0.2 MG tablet Take 1 tablet (0.2 mg total) by mouth 3 (three) times daily., Starting Tue  09/29/2015, Until Fri 10/02/2015, Print    dicyclomine (BENTYL) 20 MG tablet Take 1 tablet (20 mg total) by mouth every 6 (six) hours as needed for spasms., Starting Tue 09/29/2015, Print    ondansetron (ZOFRAN) 4 MG tablet Take 1 tablet (4 mg total) by mouth every 6 (six) hours., Starting Tue 09/29/2015, Print        I personally preformed the services scribed in my presence. The recorded information has been reviewed is accurate. Raeford RazorStephen Yossi Hinchman, MD.     Raeford RazorStephen Yuvia Plant, MD 10/11/15 [phone]

## 2015-09-29 NOTE — ED Triage Notes (Signed)
Patient here requesting detox from heroine and cocaine, last used early am, denies etoh. States that he fells fatigued and complains of diarrhea. Alert and oriented, NAD. Denies suicidal ideation

## 2016-02-26 IMAGING — DX DG WRIST COMPLETE 3+V*L*
4 series · 4 of 4 positions shown · non-contrast
Comparison: None.

CLINICAL DATA: Fell 10 feet from roof landing on his feet. Wrist
pain.

EXAM:
LEFT WRIST - COMPLETE 3+ VIEW

[wrist pa]
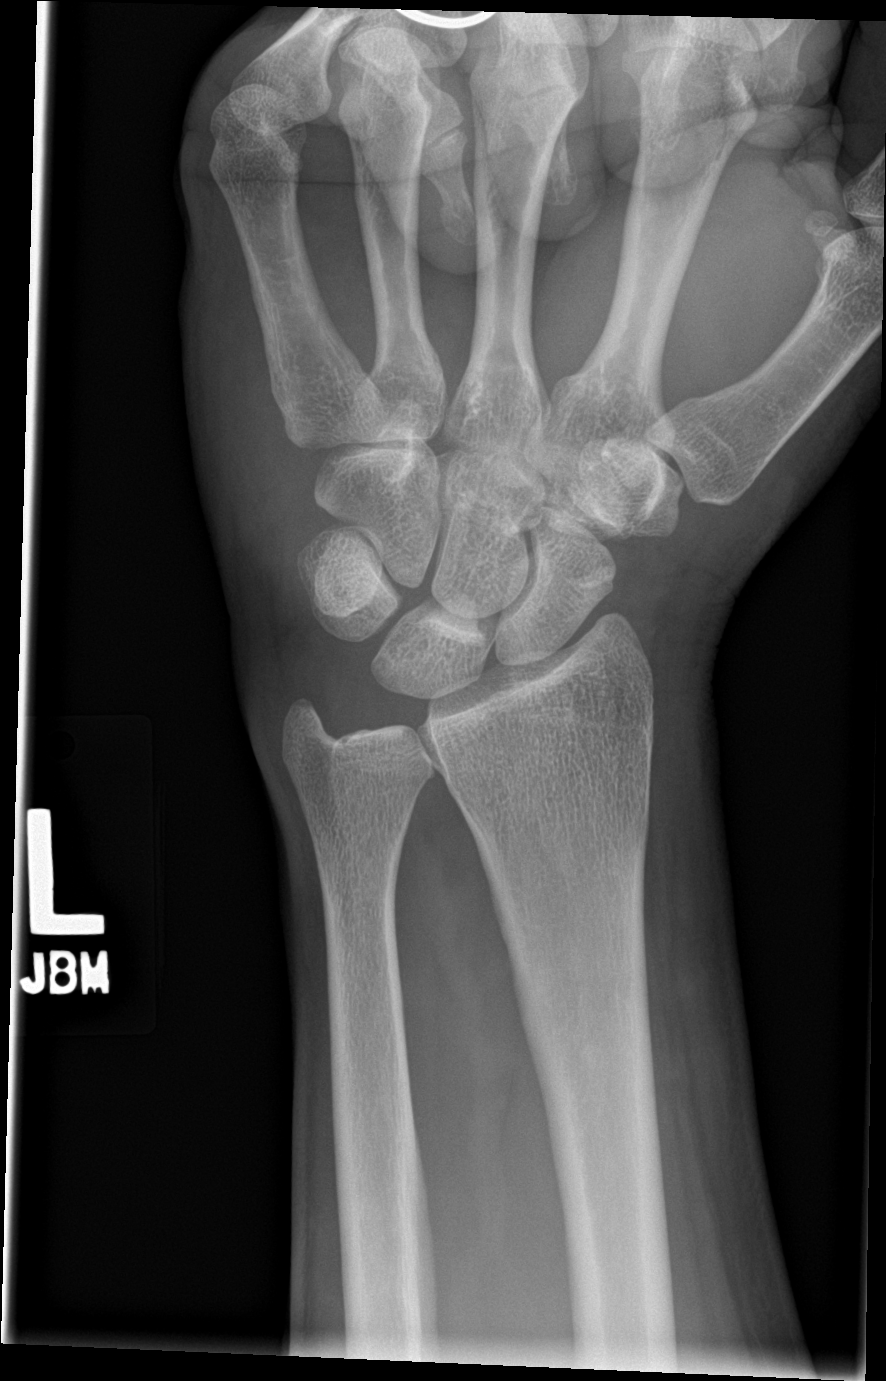

[wrist obl]
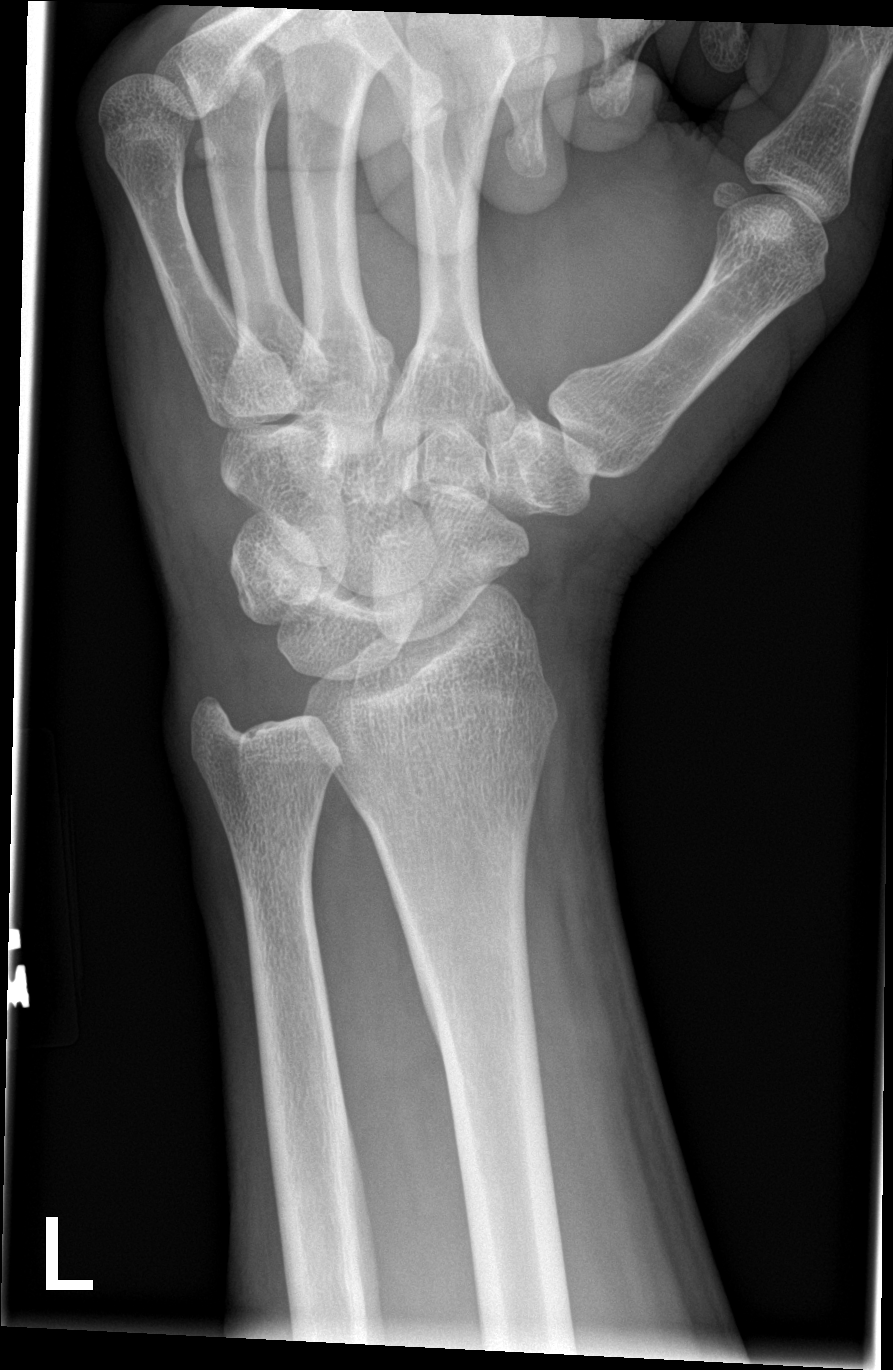

[wrist lat]
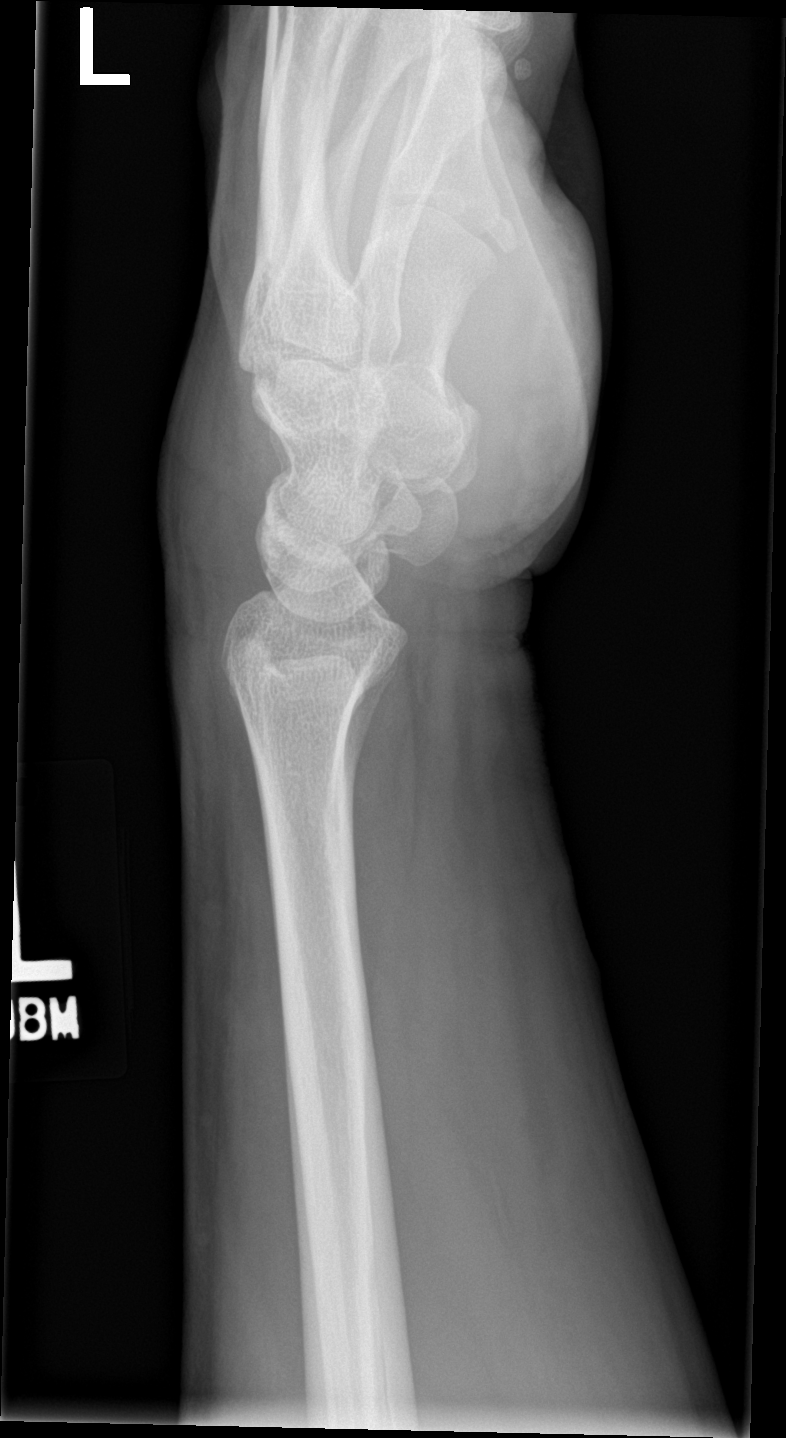

[wrist navicular]
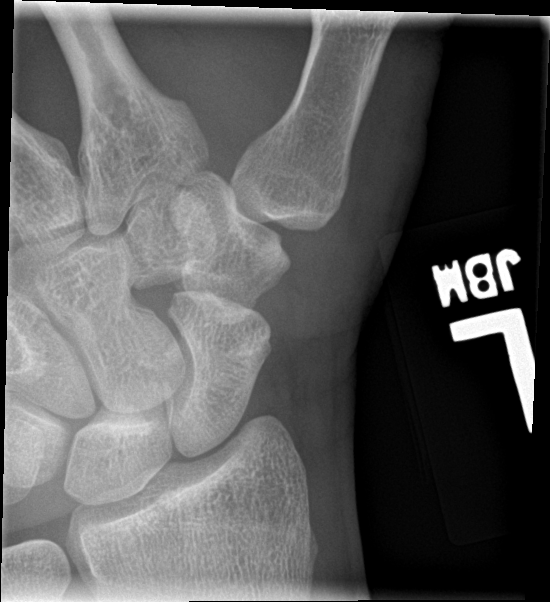

[4 of 4 positions shown; findings below may reference images not displayed]

FINDINGS: There is no evidence of fracture or dislocation. There is no
evidence of arthropathy or other focal bone abnormality. Soft
tissues are unremarkable.
IMPRESSION: Negative.

## 2016-09-16 ENCOUNTER — Ambulatory Visit: Payer: Self-pay | Admitting: Family Medicine

## 2016-10-01 ENCOUNTER — Emergency Department (HOSPITAL_COMMUNITY)
Admission: EM | Admit: 2016-10-01 | Discharge: 2016-10-01 | Disposition: A | Discharge status: Home / Self Care | Payer: BLUE CROSS/BLUE SHIELD | Attending: Emergency Medicine | Admitting: Emergency Medicine

## 2016-10-01 ENCOUNTER — Encounter (HOSPITAL_COMMUNITY): Payer: Self-pay | Admitting: Emergency Medicine

## 2016-10-01 DIAGNOSIS — Z87891 Personal history of nicotine dependence: Secondary | ICD-10-CM | POA: Diagnosis not present

## 2016-10-01 DIAGNOSIS — R112 Nausea with vomiting, unspecified: Secondary | ICD-10-CM

## 2016-10-01 DIAGNOSIS — E876 Hypokalemia: Secondary | ICD-10-CM

## 2016-10-01 DIAGNOSIS — Z79899 Other long term (current) drug therapy: Secondary | ICD-10-CM | POA: Insufficient documentation

## 2016-10-01 LAB — CBC
HCT: 41.6 % (ref 39.0–52.0)
Hemoglobin: 13.6 g/dL (ref 13.0–17.0)
MCH: 28.9 pg (ref 26.0–34.0)
MCHC: 32.7 g/dL (ref 30.0–36.0)
MCV: 88.3 fL (ref 78.0–100.0)
PLATELETS: 195 10*3/uL (ref 150–400)
RBC: 4.71 MIL/uL (ref 4.22–5.81)
RDW: 12.1 % (ref 11.5–15.5)
WBC: 6.6 10*3/uL (ref 4.0–10.5)

## 2016-10-01 LAB — COMPREHENSIVE METABOLIC PANEL
ALBUMIN: 3.5 g/dL (ref 3.5–5.0)
ALT: 29 U/L (ref 17–63)
AST: 23 U/L (ref 15–41)
Alkaline Phosphatase: 44 U/L (ref 38–126)
Anion gap: 3 — ABNORMAL LOW (ref 5–15)
BILIRUBIN TOTAL: 0.3 mg/dL (ref 0.3–1.2)
BUN: <5 mg/dL — ABNORMAL LOW (ref 6–20)
CALCIUM: 9 mg/dL (ref 8.9–10.3)
CO2: 33 mmol/L — AB (ref 22–32)
CREATININE: 0.83 mg/dL (ref 0.61–1.24)
Chloride: 102 mmol/L (ref 101–111)
GFR calc Af Amer: >60 mL/min (ref >60)
Glucose, Bld: 112 mg/dL — ABNORMAL HIGH (ref 65–99)
Potassium: 3.2 mmol/L — ABNORMAL LOW (ref 3.5–5.1)
Sodium: 138 mmol/L (ref 135–145)
Total Protein: 5.6 g/dL — ABNORMAL LOW (ref 6.5–8.1)

## 2016-10-01 LAB — LIPASE, BLOOD: Lipase: 34 U/L (ref 11–51)

## 2016-10-01 MED ORDER — ONDANSETRON 8 MG PO TBDP: 8.0000 mg | ORAL_TABLET | Freq: Three times a day (TID) | ORAL | 0 refills | Status: DC | PRN

## 2016-10-01 MED ORDER — ONDANSETRON 4 MG PO TBDP
8.0000 mg | ORAL_TABLET | Freq: Once | ORAL | Status: AC
  Administered 2016-10-01: 8 mg via ORAL
  Filled 2016-10-01: qty 2

## 2016-10-01 MED ORDER — POTASSIUM CHLORIDE CRYS ER 20 MEQ PO TBCR
40.0000 meq | EXTENDED_RELEASE_TABLET | Freq: Once | ORAL | Status: AC
  Administered 2016-10-01: 40 meq via ORAL
  Filled 2016-10-01: qty 2

## 2016-10-01 NOTE — ED Notes (Signed)
Pt given urinal and instructed if able to provide urine sample. To give to nurse first.

## 2016-10-01 NOTE — ED Notes (Signed)
Pt drinking ginger ale  

## 2016-10-01 NOTE — ED Notes (Signed)
Pt ambulated to restroom. 

## 2016-10-01 NOTE — ED Notes (Signed)
Pt called 3X to go back to room; no response

## 2016-10-01 NOTE — ED Provider Notes (Signed)
[facility] Provider Note   CSN: [id number] Arrival date & time: 10/01/16  1456     History   Chief Complaint Chief Complaint  Patient presents with  . Abdominal Pain    HPI Alene MiresBobby Holdren is a 41 y.o. male.  HPI 41 year old male presents today complaining of nausea and vomiting that began 5 days prior to evaluation.Hedenies any pain to me although is noted in the nurse's notes that he had some mid abdominal pain. He denies any fever or chills. Describes the emesis as food or yellowish in color. He has been tolerating liquids at home. He denies any similar symptoms in the past. He is on methadone from a methadone clinic and describes missing a few days of this while at the beach. Denies any current substance abuse. Past Medical History:  Diagnosis Date  . Heroin abuse   . Knee pain, left     Patient Active Problem List   Diagnosis Date Noted  . KNEE PAIN, LEFT 08/10/2007  . OVERWEIGHT 02/23/2007    History reviewed. No pertinent surgical history.     Home Medications    Prior to Admission medications   Medication Sig Start Date End Date Taking? Authorizing Provider  acyclovir (ZOVIRAX) 400 MG tablet Take 2.5 tablets (1,000 mg total) by mouth 3 (three) times daily. 08/03/15   Janne NapoleonNeese, Hope M, NP  benzonatate (TESSALON) 100 MG capsule Take 1 capsule (100 mg total) by mouth every 8 (eight) hours. 03/09/14   Santiago GladLaisure, Heather, PA-C  cephALEXin (KEFLEX) 500 MG capsule Take 1 capsule (500 mg total) by mouth 4 (four) times daily. 02/23/15   Ward, Chase PicketJaime Pilcher, PA-C  cloNIDine (CATAPRES) 0.2 MG tablet Take 1 tablet (0.2 mg total) by mouth 3 (three) times daily. 09/29/15 10/02/15  Raeford RazorKohut, Stephen, MD  dicyclomine (BENTYL) 20 MG tablet Take 1 tablet (20 mg total) by mouth every 6 (six) hours as needed for spasms. 09/29/15   Raeford RazorKohut, Stephen, MD  HYDROcodone-acetaminophen (NORCO/VICODIN) 5-325 MG per tablet Take 2 tablets by mouth every 4 (four) hours as needed for moderate pain or severe  pain. 02/03/14   Cartner, Sharlet SalinaBenjamin, PA-C  hydrOXYzine (ATARAX/VISTARIL) 25 MG tablet Take 1 tablet (25 mg total) by mouth every 6 (six) hours. 08/03/15   Janne NapoleonNeese, Hope M, NP  ondansetron (ZOFRAN) 4 MG tablet Take 1 tablet (4 mg total) by mouth every 6 (six) hours. 09/29/15   Raeford RazorKohut, Stephen, MD  oxymetazoline (AFRIN NASAL SPRAY) 0.05 % nasal spray Place 1 spray into both nostrils 2 (two) times daily. 03/09/14   Santiago GladLaisure, Heather, PA-C  predniSONE (STERAPRED UNI-PAK 21 TAB) 10 MG (21) TBPK tablet Take 2 tablets (20 mg total) by mouth 2 (two) times daily. 08/03/15   Janne NapoleonNeese, Hope M, NP    Family History No family history on file.  Social History Social History  Substance Use Topics  . Smoking status: Former Games developermoker  . Smokeless tobacco: Never Used  . Alcohol use No     Allergies   Ibuprofen   Review of Systems Review of Systems  All other systems reviewed and are negative.    Physical Exam Updated Vital Signs BP 102/74   Pulse 94   Temp 98.5 F (36.9 C) (Oral)   Resp 18   SpO2 98%   Physical Exam  Constitutional: He is oriented to person, place, and time. He appears well-developed.  HENT:  Head: Normocephalic and atraumatic.  Right Ear: External ear normal.  Left Ear: External ear normal.  Nose: Nose normal.  Eyes: EOM are normal.  Neck: No tracheal deviation present.  Cardiovascular: Normal rate, regular rhythm and normal heart sounds.   Pulmonary/Chest: Effort normal.  Abdominal: Soft. Bowel sounds are normal. He exhibits no distension. There is no tenderness. There is no guarding.  Musculoskeletal: Normal range of motion.  Neurological: He is alert and oriented to person, place, and time.  Skin: Skin is warm and dry.  Psychiatric: He has a normal mood and affect. His behavior is normal.  Nursing note and vitals reviewed.    ED Treatments / Results  Labs (all labs ordered are listed, but only abnormal results are displayed) Labs Reviewed  COMPREHENSIVE METABOLIC PANEL -  Abnormal; Notable for the following:       Result Value   Potassium 3.2 (*)    CO2 33 (*)    Glucose, Bld 112 (*)    BUN <5 (*)    Total Protein 5.6 (*)    Anion gap 3 (*)    All other components within normal limits  LIPASE, BLOOD  CBC  URINALYSIS, ROUTINE W REFLEX MICROSCOPIC    EKG  EKG Interpretation None       Radiology No results found.  Procedures Procedures (including critical care time)  Medications Ordered in ED Medications  ondansetron (ZOFRAN-ODT) disintegrating tablet 8 mg (8 mg Oral Given 10/01/16 1730)  potassium chloride SA (K-DUR,KLOR-CON) CR tablet 40 mEq (40 mEq Oral Given 10/01/16 1834)     Initial Impression / Assessment and Plan / ED Course  I have reviewed the triage vital signs and the nursing notes.  Pertinent labs & imaging results that were available during my care of the patient were reviewed by me and considered in my medical decision making (see chart for details).     Patient given Zofran here and tolerating liquids and crackers without difficulty. He is given a dose of potassium here. I discussed his laboratory results and physical exam findings with the patient. We discussed return precautions and need for follow-up and he voices understanding.  Final Clinical Impressions(s) / ED Diagnoses   Final diagnoses:  Nausea and vomiting, intractability of vomiting not specified, unspecified vomiting type  Hypokalemia    New Prescriptions New Prescriptions   ONDANSETRON (ZOFRAN ODT) 8 MG DISINTEGRATING TABLET    Take 1 tablet (8 mg total) by mouth every 8 (eight) hours as needed for nausea or vomiting.     Margarita Grizzle, MD 10/01/16 [phone]

## 2016-10-01 NOTE — ED Triage Notes (Signed)
Pt states since Monday he has had mid abd pain with n/v.

## 2016-10-05 ENCOUNTER — Emergency Department (HOSPITAL_COMMUNITY)
Admission: EM | Admit: 2016-10-05 | Discharge: 2016-10-05 | Disposition: A | Discharge status: Home / Self Care | Payer: BLUE CROSS/BLUE SHIELD | Attending: Emergency Medicine | Admitting: Emergency Medicine

## 2016-10-05 ENCOUNTER — Encounter (HOSPITAL_COMMUNITY): Payer: Self-pay

## 2016-10-05 DIAGNOSIS — Z046 Encounter for general psychiatric examination, requested by authority: Secondary | ICD-10-CM | POA: Diagnosis not present

## 2016-10-05 DIAGNOSIS — F1420 Cocaine dependence, uncomplicated: Secondary | ICD-10-CM | POA: Diagnosis not present

## 2016-10-05 DIAGNOSIS — Z87891 Personal history of nicotine dependence: Secondary | ICD-10-CM | POA: Diagnosis not present

## 2016-10-05 DIAGNOSIS — F1010 Alcohol abuse, uncomplicated: Secondary | ICD-10-CM | POA: Insufficient documentation

## 2016-10-05 DIAGNOSIS — Z79899 Other long term (current) drug therapy: Secondary | ICD-10-CM | POA: Diagnosis not present

## 2016-10-05 DIAGNOSIS — F1120 Opioid dependence, uncomplicated: Secondary | ICD-10-CM

## 2016-10-05 DIAGNOSIS — F1114 Opioid abuse with opioid-induced mood disorder: Secondary | ICD-10-CM | POA: Diagnosis not present

## 2016-10-05 DIAGNOSIS — Z008 Encounter for other general examination: Secondary | ICD-10-CM | POA: Diagnosis not present

## 2016-10-05 LAB — CBC
HCT: 39.1 % (ref 39.0–52.0)
HEMOGLOBIN: 12.9 g/dL — AB (ref 13.0–17.0)
MCH: 30 pg (ref 26.0–34.0)
MCHC: 33 g/dL (ref 30.0–36.0)
MCV: 90.9 fL (ref 78.0–100.0)
Platelets: 189 10*3/uL (ref 150–400)
RBC: 4.3 MIL/uL (ref 4.22–5.81)
RDW: 12.6 % (ref 11.5–15.5)
WBC: 8.7 10*3/uL (ref 4.0–10.5)

## 2016-10-05 LAB — COMPREHENSIVE METABOLIC PANEL
ALBUMIN: 3.1 g/dL — AB (ref 3.5–5.0)
ALK PHOS: 46 U/L (ref 38–126)
ALT: 18 U/L (ref 17–63)
AST: 15 U/L (ref 15–41)
Anion gap: 7 (ref 5–15)
BILIRUBIN TOTAL: 0.3 mg/dL (ref 0.3–1.2)
BUN: 9 mg/dL (ref 6–20)
CALCIUM: 8.8 mg/dL — AB (ref 8.9–10.3)
CO2: 31 mmol/L (ref 22–32)
CREATININE: 1.04 mg/dL (ref 0.61–1.24)
Chloride: 104 mmol/L (ref 101–111)
GFR calc Af Amer: >60 mL/min (ref >60)
GFR calc non Af Amer: >60 mL/min (ref >60)
GLUCOSE: 116 mg/dL — AB (ref 65–99)
Potassium: 3.5 mmol/L (ref 3.5–5.1)
Sodium: 142 mmol/L (ref 135–145)
TOTAL PROTEIN: 5.3 g/dL — AB (ref 6.5–8.1)

## 2016-10-05 LAB — ACETAMINOPHEN LEVEL: Acetaminophen (Tylenol), Serum: <10 ug/mL — ABNORMAL LOW (ref 10–30)

## 2016-10-05 LAB — RAPID URINE DRUG SCREEN, HOSP PERFORMED
AMPHETAMINES: NOT DETECTED
BENZODIAZEPINES: NOT DETECTED
Barbiturates: NOT DETECTED
Cocaine: POSITIVE — AB
OPIATES: POSITIVE — AB
TETRAHYDROCANNABINOL: NOT DETECTED

## 2016-10-05 LAB — ETHANOL: Alcohol, Ethyl (B): <5 mg/dL (ref <5)

## 2016-10-05 LAB — SALICYLATE LEVEL: Salicylate Lvl: <7 mg/dL (ref 2.8–30.0)

## 2016-10-05 MED ORDER — ACETAMINOPHEN 325 MG PO TABS: 650.0000 mg | ORAL_TABLET | ORAL | Status: DC | PRN

## 2016-10-05 MED ORDER — ALUM & MAG HYDROXIDE-SIMETH 200-200-20 MG/5ML PO SUSP: 30.0000 mL | Freq: Four times a day (QID) | ORAL | Status: DC | PRN

## 2016-10-05 MED ORDER — ONDANSETRON HCL 4 MG PO TABS: 4.0000 mg | ORAL_TABLET | Freq: Three times a day (TID) | ORAL | Status: DC | PRN

## 2016-10-05 MED ORDER — ZOLPIDEM TARTRATE 5 MG PO TABS
5.0000 mg | ORAL_TABLET | Freq: Every evening | ORAL | Status: DC | PRN
  Administered 2016-10-05: 5 mg via ORAL
  Filled 2016-10-05: qty 1

## 2016-10-05 MED ORDER — NICOTINE 21 MG/24HR TD PT24: 21.0000 mg | MEDICATED_PATCH | Freq: Every day | TRANSDERMAL | Status: DC | PRN

## 2016-10-05 NOTE — [facility] Suicide Risk Assessment (Signed)
Suicide Risk Assessment  Discharge Assessment   [facility]   Principal Problem: Opioid abuse with opioid-induced mood disorder [facility](HCC) Discharge Diagnoses:  Patient Active Problem List   Diagnosis Date Noted  . Opioid use disorder, moderate, dependence (HCC) [F11.20] 10/05/2016  . Cocaine use disorder, moderate, dependence (HCC) [F14.20] 10/05/2016  . Opioid abuse with opioid-induced mood disorder (HCC) [F11.14] 10/05/2016  . Involuntary commitment [Z04.6]   . KNEE PAIN, LEFT [M25.569] 08/10/2007  . OVERWEIGHT [E66.3] 02/23/2007    Total Time spent with patient: 45 minutes  Musculoskeletal: Strength & Muscle Tone: within normal limits Gait & Station: normal Patient leans: N/A  Psychiatric Specialty Exam: Physical Exam  Constitutional: He is oriented to person, place, and time. He appears well-developed and well-nourished.  HENT:  Head: Normocephalic.  Respiratory: Effort normal.  Musculoskeletal: Normal range of motion.  Neurological: He is alert and oriented to person, place, and time.  Psychiatric: His speech is normal. Thought content normal. He is slowed. Cognition and memory are normal. He expresses impulsivity. He exhibits a depressed mood.   Review of Systems  Psychiatric/Behavioral: Positive for depression and substance abuse. Negative for hallucinations, memory loss and suicidal ideas. The patient is not nervous/anxious and does not have insomnia.   All other systems reviewed and are negative.  Blood pressure (!) 103/46, pulse (!) 56, temperature 98.2 F (36.8 C), temperature source Oral, resp. rate 18, SpO2 100 %.There is no height or weight on file to calculate BMI. General Appearance: Casual Eye Contact:  Fair Speech:  Clear and Coherent Volume:  Decreased Mood:  Depressed Affect:  Congruent Thought Process:  Coherent and Linear Orientation:  Full (Time, Place, and Person) Thought Content:  Logical Suicidal Thoughts:  No Homicidal  Thoughts:  No Memory:  Immediate;   Good Recent;   Good Remote;   Fair Judgement:  Fair Insight:  Fair Psychomotor Activity:  Normal Concentration:  Concentration: Good and Attention Span: Fair Recall:  [employer] BrandsFair Fund of Knowledge:  Fair Language:  Good Akathisia:  No Handed:  Right AIMS (if indicated):    Assets:  ArchitectCommunication Skills Financial Resources/Insurance Housing Resilience Social Support ADL's:  Intact Cognition:  WNL  Mental Status Per Nursing Assessment::   On Admission:   suicidal ideation  Demographic Factors:  Male, Caucasian and Low socioeconomic status  Loss Factors: Financial problems/change in socioeconomic status  Historical Factors: Impulsivity  Risk Reduction Factors:   Sense of responsibility to family, Employed and Living with another person, especially a relative  Continued Clinical Symptoms:  Depression:   Impulsivity Alcohol/Substance Abuse/Dependencies  Cognitive Features That Contribute To Risk:  Closed-mindedness    Suicide Risk:  Minimal: No identifiable suicidal ideation.  Patients presenting with no risk factors but with morbid ruminations; may be classified as minimal risk based on the severity of the depressive symptoms    Plan Of Care/Follow-up recommendations:  Activity:  as tolerated Diet:  Heart Healthy   Laveda AbbeLaurie Britton Mavis Fichera, NP 10/05/2016, 12:11 PM

## 2016-10-05 NOTE — ED Notes (Addendum)
Pt discharged ambulatory.  Pt denies S/I and H/I.  All belongings were returned to pt.  Discharge instructions were reviewed.

## 2016-10-05 NOTE — Discharge Instructions (Signed)
To help you maintain a sober lifestyle, a substance abuse treatment program may be beneficial to you.  Contact [facility] at your earliest opportunity to ask about enrolling in their program:       [facility]      [address]      [place], [place] [address]      [phone] 

## 2016-10-05 NOTE — ED Notes (Signed)
Patient placed in paper scrubs and has been wanded

## 2016-10-05 NOTE — Consult Note (Signed)
[facility] Psychiatry Consult   Reason for Consult:   Referring Physician:  EDP Patient Identification: William Bass MRN:  [id number] Principal Diagnosis: Opioid abuse with opioid-induced mood disorder [facility]) Diagnosis:   Patient Active Problem List   Diagnosis Date Noted  . Opioid use disorder, moderate, dependence ([place]) [F11.20] 10/05/2016  . Cocaine use disorder, moderate, dependence ([place]) [F14.20] 10/05/2016  . Opioid abuse with opioid-induced mood disorder ([place]) [F11.14] 10/05/2016  . Involuntary commitment [Z04.6]   . KNEE PAIN, LEFT [M25.569] 08/10/2007  . OVERWEIGHT [E66.3] 02/23/2007    Total Time spent with patient: 45 minutes  Subjective:   William Bass is a 41 y.o. male patient admitted under involuntary committment.   HPI:  Pt was seen and chart reviewed with Dr Darleene Cleaver. Pt stated he doesn't know why he is here and doesn't know why his wife put him under involuntary commitment. Pt stated he wasn't feeling well last night so that is why I am here. Today,  Pt denies suicidal/homicidal ideation, denies auditory/visual hallucinations and does not appear to be responding to internal stimuli. Pt stated he has been using cocaine and heroin for several years and has been in rehab, last admit was 2017. Pt's UDS positive for cocaine and opiates, BAL negative. Pt stated he has a job and is willing to follow up with outpatient resources for substance abuse treatment.   Past Psychiatric History: As above  Risk to Self: None Risk to Others: None Prior Outpatient Therapy: Prior Outpatient Therapy: No Prior Therapy Dates: NA Prior Therapy Facilty/Provider(s): NA Reason for Treatment: NA Does patient have an ACCT team?: No Does patient have Intensive In-House Services?  : No Does patient have [facility] services? : No Does patient have P4CC services?: No  Past Medical History:  Past Medical History:  Diagnosis Date  . Heroin abuse   . Knee pain, left    History reviewed. No  pertinent surgical history. Family History: History reviewed. No pertinent family history. Family Psychiatric  History: Unknown Social History:  History  Alcohol Use No     History  Drug Use  . Types: Cocaine    Social History   Social History  . Marital status: Single    Spouse name: N/A  . Number of children: N/A  . Years of education: N/A   Social History Main Topics  . Smoking status: Former Research scientist (life sciences)  . Smokeless tobacco: Never Used  . Alcohol use No  . Drug use: Yes    Types: Cocaine  . Sexual activity: Not Asked     Comment: Heroine   Other Topics Concern  . None   Social History Narrative  . None   Additional Social History:    Allergies:   Allergies  Allergen Reactions  . Ibuprofen Swelling    Labs:  Results for orders placed or performed during the hospital encounter of 10/05/16 (from the past 48 hour(s))  Rapid urine drug screen (hospital performed)     Status: Abnormal   Collection Time: 10/05/16 12:22 AM  Result Value Ref Range   Opiates POSITIVE (A) NONE DETECTED   Cocaine POSITIVE (A) NONE DETECTED   Benzodiazepines NONE DETECTED NONE DETECTED   Amphetamines NONE DETECTED NONE DETECTED   Tetrahydrocannabinol NONE DETECTED NONE DETECTED   Barbiturates NONE DETECTED NONE DETECTED    Comment:        DRUG SCREEN FOR MEDICAL PURPOSES ONLY.  IF CONFIRMATION IS NEEDED FOR ANY PURPOSE, NOTIFY LAB WITHIN 5 DAYS.  LOWEST DETECTABLE LIMITS FOR URINE DRUG SCREEN Drug Class       Cutoff (ng/mL) Amphetamine      1000 Barbiturate      200 Benzodiazepine   979 Tricyclics       892 Opiates          300 Cocaine          300 THC              50   Comprehensive metabolic panel     Status: Abnormal   Collection Time: 10/05/16 12:43 AM  Result Value Ref Range   Sodium 142 135 - 145 mmol/L   Potassium 3.5 3.5 - 5.1 mmol/L   Chloride 104 101 - 111 mmol/L   CO2 31 22 - 32 mmol/L   Glucose, Bld 116 (H) 65 - 99 mg/dL   BUN 9 6 - 20 mg/dL    Creatinine, Ser 1.04 0.61 - 1.24 mg/dL   Calcium 8.8 (L) 8.9 - 10.3 mg/dL   Total Protein 5.3 (L) 6.5 - 8.1 g/dL   Albumin 3.1 (L) 3.5 - 5.0 g/dL   AST 15 15 - 41 U/L   ALT 18 17 - 63 U/L   Alkaline Phosphatase 46 38 - 126 U/L   Total Bilirubin 0.3 0.3 - 1.2 mg/dL   GFR calc non Af Amer >60 >60 mL/min   GFR calc Af Amer >60 >60 mL/min    Comment: (NOTE) The eGFR has been calculated using the CKD EPI equation. This calculation has not been validated in all clinical situations. eGFR's persistently <60 mL/min signify possible Chronic Kidney Disease.    Anion gap 7 5 - 15  Ethanol     Status: None   Collection Time: 10/05/16 12:43 AM  Result Value Ref Range   Alcohol, Ethyl (B) <5 <5 mg/dL    Comment:        LOWEST DETECTABLE LIMIT FOR SERUM ALCOHOL IS 5 mg/dL FOR MEDICAL PURPOSES ONLY   Salicylate level     Status: None   Collection Time: 10/05/16 12:43 AM  Result Value Ref Range   Salicylate Lvl <1.1 2.8 - 30.0 mg/dL  Acetaminophen level     Status: Abnormal   Collection Time: 10/05/16 12:43 AM  Result Value Ref Range   Acetaminophen (Tylenol), Serum <10 (L) 10 - 30 ug/mL    Comment:        THERAPEUTIC CONCENTRATIONS VARY SIGNIFICANTLY. A RANGE OF 10-30 ug/mL MAY BE AN EFFECTIVE CONCENTRATION FOR MANY PATIENTS. HOWEVER, SOME ARE BEST TREATED AT CONCENTRATIONS OUTSIDE THIS RANGE. ACETAMINOPHEN CONCENTRATIONS >150 ug/mL AT 4 HOURS AFTER INGESTION AND >50 ug/mL AT 12 HOURS AFTER INGESTION ARE OFTEN ASSOCIATED WITH TOXIC REACTIONS.   cbc     Status: Abnormal   Collection Time: 10/05/16 12:43 AM  Result Value Ref Range   WBC 8.7 4.0 - 10.5 K/uL   RBC 4.30 4.22 - 5.81 MIL/uL   Hemoglobin 12.9 (L) 13.0 - 17.0 g/dL   HCT 39.1 39.0 - 52.0 %   MCV 90.9 78.0 - 100.0 fL   MCH 30.0 26.0 - 34.0 pg   MCHC 33.0 30.0 - 36.0 g/dL   RDW 12.6 11.5 - 15.5 %   Platelets 189 150 - 400 K/uL    Current Facility-Administered Medications  Medication Dose Route Frequency Provider  Last Rate Last Dose  . acetaminophen (TYLENOL) tablet 650 mg  650 mg Oral Q4H PRN Larene Pickett, PA-C      . alum & Iris Pert  hydroxide-simeth (MAALOX/MYLANTA) 200-200-20 MG/5ML suspension 30 mL  30 mL Oral Q6H PRN Larene Pickett, PA-C      . nicotine (NICODERM CQ - dosed in mg/24 hours) patch 21 mg  21 mg Transdermal Daily PRN Larene Pickett, PA-C      . ondansetron [facility]) tablet 4 mg  4 mg Oral Q8H PRN Larene Pickett, PA-C      . zolpidem (AMBIEN) tablet 5 mg  5 mg Oral QHS PRN Larene Pickett, PA-C   5 mg at 10/05/16 0148   Current Outpatient Prescriptions  Medication Sig Dispense Refill  . acyclovir (ZOVIRAX) 400 MG tablet Take 2.5 tablets (1,000 mg total) by mouth 3 (three) times daily. (Patient not taking: Reported on 10/05/2016) 60 tablet 0  . benzonatate (TESSALON) 100 MG capsule Take 1 capsule (100 mg total) by mouth every 8 (eight) hours. (Patient not taking: Reported on 10/05/2016) 21 capsule 0  . cephALEXin (KEFLEX) 500 MG capsule Take 1 capsule (500 mg total) by mouth 4 (four) times daily. (Patient not taking: Reported on 10/05/2016) 40 capsule 0  . cloNIDine (CATAPRES) 0.2 MG tablet Take 1 tablet (0.2 mg total) by mouth 3 (three) times daily. (Patient not taking: Reported on 10/05/2016) 9 tablet 0  . dicyclomine (BENTYL) 20 MG tablet Take 1 tablet (20 mg total) by mouth every 6 (six) hours as needed for spasms. (Patient not taking: Reported on 10/05/2016) 12 tablet 0  . HYDROcodone-acetaminophen (NORCO/VICODIN) 5-325 MG per tablet Take 2 tablets by mouth every 4 (four) hours as needed for moderate pain or severe pain. (Patient not taking: Reported on 10/05/2016) 6 tablet 0  . hydrOXYzine (ATARAX/VISTARIL) 25 MG tablet Take 1 tablet (25 mg total) by mouth every 6 (six) hours. (Patient not taking: Reported on 10/05/2016) 20 tablet 0  . ondansetron (ZOFRAN ODT) 8 MG disintegrating tablet Take 1 tablet (8 mg total) by mouth every 8 (eight) hours as needed for nausea or vomiting. (Patient not taking:  Reported on 10/05/2016) 20 tablet 0  . ondansetron (ZOFRAN) 4 MG tablet Take 1 tablet (4 mg total) by mouth every 6 (six) hours. (Patient not taking: Reported on 10/05/2016) 12 tablet 0  . oxymetazoline (AFRIN NASAL SPRAY) 0.05 % nasal spray Place 1 spray into both nostrils 2 (two) times daily. (Patient not taking: Reported on 10/05/2016) 30 mL 0  . predniSONE (STERAPRED UNI-PAK 21 TAB) 10 MG (21) TBPK tablet Take 2 tablets (20 mg total) by mouth 2 (two) times daily. (Patient not taking: Reported on 10/05/2016) 20 tablet 0    Musculoskeletal: Strength & Muscle Tone: within normal limits Gait & Station: normal Patient leans: N/A  Psychiatric Specialty Exam: Physical Exam  Constitutional: He is oriented to person, place, and time. He appears well-developed and well-nourished.  HENT:  Head: Normocephalic.  Respiratory: Effort normal.  Musculoskeletal: Normal range of motion.  Neurological: He is alert and oriented to person, place, and time.  Psychiatric: His speech is normal. Thought content normal. He is slowed. Cognition and memory are normal. He expresses impulsivity. He exhibits a depressed mood.    Review of Systems  Psychiatric/Behavioral: Positive for depression and substance abuse. Negative for hallucinations, memory loss and suicidal ideas. The patient is not nervous/anxious and does not have insomnia.   All other systems reviewed and are negative.   Blood pressure (!) 103/46, pulse (!) 56, temperature 98.2 F (36.8 C), temperature source Oral, resp. rate 18, SpO2 100 %.There is no height or weight on file to calculate  BMI.  General Appearance: Casual  Eye Contact:  Fair  Speech:  Clear and Coherent  Volume:  Decreased  Mood:  Depressed  Affect:  Congruent  Thought Process:  Coherent and Linear  Orientation:  Full (Time, Place, and Person)  Thought Content:  Logical  Suicidal Thoughts:  No  Homicidal Thoughts:  No  Memory:  Immediate;   Good Recent;   Good Remote;   Fair   Judgement:  Fair  Insight:  Fair  Psychomotor Activity:  Normal  Concentration:  Concentration: Good and Attention Span: Fair  Recall:  [employer] of Knowledge:  Fair  Language:  Good  Akathisia:  No  Handed:  Right  AIMS (if indicated):     Assets:  Agricultural consultant Housing Resilience Social Support  ADL's:  Intact  Cognition:  WNL  Sleep:        Treatment Plan Summary: Plan Substance induced mood disorder  Follow up at [facility] for substance abuse treatment and medication management  Avoid the use of alcohol and illicit drugs.  Follow up with your PCP for any medical concerns  Disposition: No evidence of imminent risk to self or others at present.   Patient does not meet criteria for psychiatric inpatient admission. Supportive therapy provided about ongoing stressors. Discussed crisis plan, support from social network, calling 911, coming to the Emergency Department, and calling Suicide Hotline.  Ethelene Hal, NP 10/05/2016 11:49 AM  Patient seen face-to-face for psychiatric evaluation, chart reviewed and case discussed with the physician extender and developed treatment plan. Reviewed the information documented and agree with the treatment plan. Corena Pilgrim, MD

## 2016-10-05 NOTE — ED Provider Notes (Signed)
[facility]-EMERGENCY DEPT Provider Note   CSN: [id number] Arrival date & time: 10/05/16  0021     History   Chief Complaint Chief Complaint  Patient presents with  . Suicidal    HPI William Bass is a 41 y.o. male.  The history is provided by the patient and medical records.     41 y.o. M with hx of heroin abuse, left knee pain, presenting to the ED under IVC peititoned by his wife.  Per paperwork, patient has been abusing alcohol and other substances and making threats of wanting to kill himself.  Patient refutes these statements.  He does admit to using "substances" today, notably alcohol.  States he did express to her "a while ago" that he had some suicidal thoughts but denies any in the past few days.  He adamantly denies suicidal thoughts at present. No homicidal ideation. No hallucinations. Patient has no physical complaints at this time.  Past Medical History:  Diagnosis Date  . Heroin abuse   . Knee pain, left     Patient Active Problem List   Diagnosis Date Noted  . KNEE PAIN, LEFT 08/10/2007  . OVERWEIGHT 02/23/2007    History reviewed. No pertinent surgical history.     Home Medications    Prior to Admission medications   Medication Sig Start Date End Date Taking? Authorizing Provider  acyclovir (ZOVIRAX) 400 MG tablet Take 2.5 tablets (1,000 mg total) by mouth 3 (three) times daily. Patient not taking: Reported on 10/05/2016 08/03/15   Janne Napoleon, NP  benzonatate (TESSALON) 100 MG capsule Take 1 capsule (100 mg total) by mouth every 8 (eight) hours. Patient not taking: Reported on 10/05/2016 03/09/14   Santiago Glad, PA-C  cephALEXin (KEFLEX) 500 MG capsule Take 1 capsule (500 mg total) by mouth 4 (four) times daily. Patient not taking: Reported on 10/05/2016 02/23/15   Ward, Chase Picket, PA-C  cloNIDine (CATAPRES) 0.2 MG tablet Take 1 tablet (0.2 mg total) by mouth 3 (three) times daily. Patient not taking: Reported on 10/05/2016 09/29/15 10/02/15  Raeford Razor, MD   dicyclomine (BENTYL) 20 MG tablet Take 1 tablet (20 mg total) by mouth every 6 (six) hours as needed for spasms. Patient not taking: Reported on 10/05/2016 09/29/15   Raeford Razor, MD  HYDROcodone-acetaminophen (NORCO/VICODIN) 5-325 MG per tablet Take 2 tablets by mouth every 4 (four) hours as needed for moderate pain or severe pain. Patient not taking: Reported on 10/05/2016 02/03/14   Joycie Peek, PA-C  hydrOXYzine (ATARAX/VISTARIL) 25 MG tablet Take 1 tablet (25 mg total) by mouth every 6 (six) hours. Patient not taking: Reported on 10/05/2016 08/03/15   Janne Napoleon, NP  ondansetron (ZOFRAN ODT) 8 MG disintegrating tablet Take 1 tablet (8 mg total) by mouth every 8 (eight) hours as needed for nausea or vomiting. Patient not taking: Reported on 10/05/2016 10/01/16   Margarita Grizzle, MD  ondansetron (ZOFRAN) 4 MG tablet Take 1 tablet (4 mg total) by mouth every 6 (six) hours. Patient not taking: Reported on 10/05/2016 09/29/15   Raeford Razor, MD  oxymetazoline [facility] NASAL SPRAY) 0.05 % nasal spray Place 1 spray into both nostrils 2 (two) times daily. Patient not taking: Reported on 10/05/2016 03/09/14   Santiago Glad, PA-C  predniSONE (STERAPRED UNI-PAK 21 TAB) 10 MG (21) TBPK tablet Take 2 tablets (20 mg total) by mouth 2 (two) times daily. Patient not taking: Reported on 10/05/2016 08/03/15   Janne Napoleon, NP    Family History History reviewed. No pertinent  family history.  Social History Social History  Substance Use Topics  . Smoking status: Former Games developermoker  . Smokeless tobacco: Never Used  . Alcohol use No     Allergies   Ibuprofen   Review of Systems Review of Systems  Psychiatric/Behavioral:       IVC  All other systems reviewed and are negative.    Physical Exam Updated Vital Signs BP 111/65 (BP Location: Left Arm)   Pulse 63   Temp 98.2 F (36.8 C) (Oral)   Resp 18   SpO2 96%   Physical Exam  Constitutional: He is oriented to person, place, and time. He appears  well-developed and well-nourished.  HENT:  Head: Normocephalic and atraumatic.  Mouth/Throat: Oropharynx is clear and moist.  Eyes: Pupils are equal, round, and reactive to light. Conjunctivae and EOM are normal.  Neck: Normal range of motion.  Cardiovascular: Normal rate, regular rhythm and normal heart sounds.   Pulmonary/Chest: Effort normal and breath sounds normal. No respiratory distress. He has no wheezes.  Abdominal: Soft. Bowel sounds are normal. There is no tenderness. There is no rebound.  Musculoskeletal: Normal range of motion.  Neurological: He is alert and oriented to person, place, and time.  Skin: Skin is warm and dry.  Psychiatric: He has a normal mood and affect.  Denies SI/HI/AVH  Nursing note and vitals reviewed.    ED Treatments / Results  Labs (all labs ordered are listed, but only abnormal results are displayed) Labs Reviewed  COMPREHENSIVE METABOLIC PANEL - Abnormal; Notable for the following:       Result Value   Glucose, Bld 116 (*)    Calcium 8.8 (*)    Total Protein 5.3 (*)    Albumin 3.1 (*)    All other components within normal limits  ACETAMINOPHEN LEVEL - Abnormal; Notable for the following:    Acetaminophen (Tylenol), Serum <10 (*)    All other components within normal limits  CBC - Abnormal; Notable for the following:    Hemoglobin 12.9 (*)    All other components within normal limits  RAPID URINE DRUG SCREEN, HOSP PERFORMED - Abnormal; Notable for the following:    Opiates POSITIVE (*)    Cocaine POSITIVE (*)    All other components within normal limits  ETHANOL  SALICYLATE LEVEL    EKG  EKG Interpretation None       Radiology No results found.  Procedures Procedures (including critical care time)  Medications Ordered in ED Medications - No data to display   Initial Impression / Assessment and Plan / ED Course  I have reviewed the triage vital signs and the nursing notes.  Pertinent labs & imaging results that were  available during my care of the patient were reviewed by me and considered in my medical decision making (see chart for details).  41 year old male here under IVC petitioned by his wife. Paperwork reports he has been abusing alcohol and expressing suicidal thoughts. Patient adamantly refutes this.States he is "fine". He denies any suicidal or homicidal ideation when asked directly. No hallucinations.has no documented psychiatric history.screening lab work to be obtained. Will obtain TTS consultation.  TTS has evaluated patient, recommendation is for inpatient treatment. Will seek placement.  Final Clinical Impressions(s) / ED Diagnoses   Final diagnoses:  Involuntary commitment    New Prescriptions New Prescriptions   No medications on file     Garlon HatchetSanders, Lujean Ebright M, PA-C 10/05/16 [id number]    Zadie RhineWickline, Donald, MD 10/06/16 [phone]

## 2016-10-05 NOTE — BH Assessment (Addendum)
Assessment Note  William Bass is an 41 y.o. male, who presents involuntary and unaccompanied to [facility]. Pt was a poor historian and was redirected numerous times to engage in the assessment. Clinician asked the pt, "what brought you to the hospital?" Pt replied, "my wife thought I should be here, she thinks I'm not fine but I'm fine." Pt reported, having access to guns and knives. Pt denied, SI, HI, AVH, and self-injurious behaviors.   Pt was IVC's by his wife. Pt did not give clinician consent to contact his wife to gather collateral information. Per IVC paperwork: "Respondent is not currently being treated for any mental illness but is telling petitioner and other family members that he his going to kill himself. Most recent statement was made today. Respondent is abusing alcohol and has been to rehab and [facility] previously."   Pt denied abuse. Clinician asked the if he used any substances recently. Pt replied, "in the last 24 hours, yea cocaine...fentanyl." Clinician observed the pt stop talking and dose off. Pt's UDS is positive for opiates and cocaine. Per IVC, pt had a previous inpatent admission in rehab.   Pt presents sleeping in scrubs with soft, slow incoherent speech. Clinician repeated pt's responses during the assessment to verify she documented the correct response. Pt's eye contact was poor. Pt's affect was flat. Pt's thought process was circumstantial. Pt's judgement was impaired. Pt's cooncentraion, insight and impulse control are poor. Clinician was unable to assess the following: mood, orientation, contract for safety, history of violence, education status.  Diagnosis: Substance-Induced Mood Disorder.   Past Medical History:  Past Medical History:  Diagnosis Date  . Heroin abuse   . Knee pain, left     History reviewed. No pertinent surgical history.  Family History: History reviewed. No pertinent family history.  Social History:  reports that he has quit smoking. He has never  used smokeless tobacco. He reports that he uses drugs, including Cocaine. He reports that he does not drink alcohol.  Additional Social History:  Alcohol / Drug Use Pain Medications: See MAR Prescriptions: See MAR Over the Counter: See MAR History of alcohol / drug use?: Yes Substance #1 Name of Substance 1: Opiates 1 - Age of First Use: UTA 1 - Amount (size/oz): UTA 1 - Frequency: UTA 1 - Duration: UTA 1 - Last Use / Amount: UTA Substance #2 Name of Substance 2: Cocaine 2 - Age of First Use: UTA 2 - Amount (size/oz): UTA 2 - Frequency: UTA 2 - Duration: UTA 2 - Last Use / Amount: UTA  CIWA: CIWA-Ar BP: 111/65 Pulse Rate: 63 COWS:    Allergies:  Allergies  Allergen Reactions  . Ibuprofen Swelling    Home Medications:  (Not in a hospital admission)  OB/GYN Status:  No LMP for male patient.  General Assessment Data Location of Assessment: [facility] TTS Assessment: In system Is this a Tele or Face-to-Face Assessment?: Face-to-Face Is this an Initial Assessment or a Re-assessment for this encounter?: Initial Assessment Marital status: Married Living Arrangements: Spouse/significant other Can pt return to current living arrangement?:  (UTA) Admission Status: Involuntary Referral Source: Self/Family/Friend Insurance type: BCBS     Crisis Care Plan Living Arrangements: Spouse/significant other Legal Guardian: Other: (Self) Name of Psychiatrist: NA Name of Therapist: NA  Education Status Is patient currently in school?:  (UTA) Current Grade: UTA Highest grade of school patient has completed: UTA Name of school: UTA Contact person: UTA  Risk to self with the past 6 months Suicidal Ideation:  Yes-Currently Present (Per IVC pt denies. ) Has patient been a risk to self within the past 6 months prior to admission? : No Suicidal Intent: No Has patient had any suicidal intent within the past 6 months prior to admission? : No Is patient at risk for suicide?:  No Suicidal Plan?: No Has patient had any suicidal plan within the past 6 months prior to admission? : No Access to Means: No What has been your use of drugs/alcohol within the last 12 months?: Opiates, and cocaine.  Previous Attempts/Gestures: No How many times?: 0 Other Self Harm Risks: Pt denies. Triggers for Past Attempts: None known Intentional Self Injurious Behavior: None (Pt denies. ) Family Suicide History: Unable to assess Recent stressful life event(s): Other (Comment) (UTA) Persecutory voices/beliefs?: No Depression: No Depression Symptoms:  (Pt denies. ) Substance abuse history and/or treatment for substance abuse?: Yes Suicide prevention information given to non-admitted patients: Not applicable  Risk to Others within the past 6 months Homicidal Ideation: No (Pt denies. ) Does patient have any lifetime risk of violence toward others beyond the six months prior to admission? : No Thoughts of Harm to Others: No Current Homicidal Intent: No Current Homicidal Plan: No Access to Homicidal Means: No Identified Victim: NA History of harm to others?:  (UTA) Assessment of Violence:  (UTA) Violent Behavior Description: UTA Does patient have access to weapons?: Yes (Comment) (guns and knives.) Criminal Charges Pending?: No (Pt denies. ) Does patient have a court date: No (Pt denies. ) Is patient on probation?: No (Pt denies. )  Psychosis Hallucinations: None noted Delusions: None noted  Mental Status Report Appearance/Hygiene: In scrubs Eye Contact: Poor Motor Activity: Unremarkable Speech: Soft, Slow Level of Consciousness: Sleeping Affect: Flat Anxiety Level: None Thought Processes: Circumstantial Judgement: Impaired Orientation: Unable to assess Obsessive Compulsive Thoughts/Behaviors: Unable to Assess  Cognitive Functioning Concentration: Unable to Assess Memory: Recent Impaired IQ: Average Insight: Poor Impulse Control: Poor Appetite:  (UTA) Sleep:  Unable to Assess Vegetative Symptoms: Unable to Assess  ADLScreening [facility] Assessment Services) Patient's cognitive ability adequate to safely complete daily activities?: Yes Patient able to express need for assistance with ADLs?: Yes Independently performs ADLs?: Yes (appropriate for developmental age)  Prior Inpatient Therapy Prior Inpatient Therapy: No Prior Therapy Dates: NA Prior Therapy Facilty/Provider(s): NA Reason for Treatment: NA  Prior Outpatient Therapy Prior Outpatient Therapy: No Prior Therapy Dates: NA Prior Therapy Facilty/Provider(s): NA Reason for Treatment: NA Does patient have an ACCT team?: No Does patient have Intensive In-House Services?  : No Does patient have [facility] services? : No Does patient have P4CC services?: No  ADL Screening (condition at time of admission) Patient's cognitive ability adequate to safely complete daily activities?: Yes Is the patient deaf or have difficulty hearing?: No Does the patient have difficulty seeing, even when wearing glasses/contacts?:  (UTA) Does the patient have difficulty concentrating, remembering, or making decisions?: Yes Patient able to express need for assistance with ADLs?: Yes Does the patient have difficulty dressing or bathing?:  (UTA) Independently performs ADLs?: Yes (appropriate for developmental age) Does the patient have difficulty walking or climbing stairs?: No Weakness of Legs: None Weakness of Arms/Hands: None       Abuse/Neglect Assessment (Assessment to be complete while patient is alone) Physical Abuse: Denies (Pt denies. ) Verbal Abuse: Denies (Pt denies. ) Sexual Abuse: Denies (Pt denies.) Exploitation of patient/patient's resources: Denies (Pt denies. ) Self-Neglect: Denies (Pt denies.)     Advance Directives (For Healthcare) Does Patient Have a  Medical Advance Directive?: No    Additional Information 1:1 In Past 12 Months?: No CIRT Risk: No Elopement Risk: No Does patient  have medical clearance?: No     Disposition: Nira Conn, NP recommends inpatient treatment. Disposition discussed with Misty Stanley, [place] and Brett Canales, RN. TTS to seek placement.   Disposition Initial Assessment Completed for this Encounter: Yes Disposition of Patient: Inpatient treatment program Type of inpatient treatment program: Adult  On Site Evaluation by:   Reviewed with Physician:  Misty Stanley, PA and Nira Conn, NP.  Redmond Pulling 10/05/2016 2:11 AM   Redmond Pulling, MS, [facility], [facility] Triage Specialist [phone]

## 2016-10-05 NOTE — BH Assessment (Signed)
[facility] Assessment Progress Note  Per Thedore MinsMojeed Akintayo, MD, this pt does not require psychiatric hospitalization at this time.  Pt presents under IVC initiated by pt's wife, which Dr Jannifer FranklinAkintayo has rescinded.  [facility] with [facility].  This has been included in pt's discharge instructions.  Pt's nurse, Kendal Hymendie, has been notified.  Doylene Canninghomas Phylisha Dix, MA Triage Specialist [phone]

## 2016-10-05 NOTE — ED Notes (Signed)
Pt is IVC'd by his wife Pt has been abusing alcohol and making threats of wanting to kill himself, most recent was today

## 2016-10-05 NOTE — ED Notes (Addendum)
Pt to room 36.  Pt is quiet and avoids eye contact.  Voice is soft and quiet with little interaction.  Pt denies SI and will not elaborate on admission other than "problems with my wife".  Pt denies pain or discomfort.  Pt denies HI and AVH and verbally contracts for safety.  Pt asks for something for sleep. Pt given prn sleep med.  Pt offered encouragement without any reaction from patient. Pt remains safe on unit with 15 min checks.

## 2016-10-05 NOTE — ED Notes (Signed)
Report to Brett CanalesSteve in [facility]-patient cleared to go to 136

## 2016-10-23 ENCOUNTER — Encounter (HOSPITAL_COMMUNITY): Payer: Self-pay | Admitting: Emergency Medicine

## 2016-10-23 ENCOUNTER — Emergency Department (HOSPITAL_COMMUNITY)
Admission: EM | Admit: 2016-10-23 | Discharge: 2016-10-25 | Disposition: A | Discharge status: Home / Self Care | Payer: BLUE CROSS/BLUE SHIELD | Attending: Emergency Medicine | Admitting: Emergency Medicine

## 2016-10-23 DIAGNOSIS — F1910 Other psychoactive substance abuse, uncomplicated: Secondary | ICD-10-CM | POA: Diagnosis not present

## 2016-10-23 DIAGNOSIS — Z87891 Personal history of nicotine dependence: Secondary | ICD-10-CM | POA: Diagnosis not present

## 2016-10-23 DIAGNOSIS — F1114 Opioid abuse with opioid-induced mood disorder: Secondary | ICD-10-CM | POA: Diagnosis not present

## 2016-10-23 DIAGNOSIS — F1420 Cocaine dependence, uncomplicated: Secondary | ICD-10-CM | POA: Diagnosis not present

## 2016-10-23 DIAGNOSIS — R45851 Suicidal ideations: Secondary | ICD-10-CM | POA: Insufficient documentation

## 2016-10-23 DIAGNOSIS — F329 Major depressive disorder, single episode, unspecified: Secondary | ICD-10-CM | POA: Diagnosis present

## 2016-10-23 HISTORY — DX: Major depressive disorder, single episode, unspecified: F32.9

## 2016-10-23 HISTORY — DX: Depression, unspecified: F32.A

## 2016-10-23 LAB — COMPREHENSIVE METABOLIC PANEL
ALK PHOS: 45 U/L (ref 38–126)
ALT: 18 U/L (ref 17–63)
AST: 17 U/L (ref 15–41)
Albumin: 3.8 g/dL (ref 3.5–5.0)
Anion gap: 12 (ref 5–15)
BILIRUBIN TOTAL: 1.3 mg/dL — AB (ref 0.3–1.2)
BUN: 9 mg/dL (ref 6–20)
CALCIUM: 8.9 mg/dL (ref 8.9–10.3)
CO2: 27 mmol/L (ref 22–32)
CREATININE: 1.05 mg/dL (ref 0.61–1.24)
Chloride: 98 mmol/L — ABNORMAL LOW (ref 101–111)
GFR calc Af Amer: >60 mL/min (ref >60)
GLUCOSE: 105 mg/dL — AB (ref 65–99)
POTASSIUM: 3.4 mmol/L — AB (ref 3.5–5.1)
Sodium: 137 mmol/L (ref 135–145)
TOTAL PROTEIN: 6.2 g/dL — AB (ref 6.5–8.1)

## 2016-10-23 LAB — CBC WITH DIFFERENTIAL/PLATELET
BASOS ABS: 0 10*3/uL (ref 0.0–0.1)
Basophils Relative: 0 %
Eosinophils Absolute: 0.1 10*3/uL (ref 0.0–0.7)
Eosinophils Relative: 1 %
HEMATOCRIT: 47.4 % (ref 39.0–52.0)
HEMOGLOBIN: 16.3 g/dL (ref 13.0–17.0)
Lymphocytes Relative: 24 %
Lymphs Abs: 2.4 10*3/uL (ref 0.7–4.0)
MCH: 29.9 pg (ref 26.0–34.0)
MCHC: 34.4 g/dL (ref 30.0–36.0)
MCV: 86.8 fL (ref 78.0–100.0)
MONO ABS: 1 10*3/uL (ref 0.1–1.0)
Monocytes Relative: 10 %
Neutro Abs: 6.8 10*3/uL (ref 1.7–7.7)
Neutrophils Relative %: 65 %
Platelets: 188 10*3/uL (ref 150–400)
RBC: 5.46 MIL/uL (ref 4.22–5.81)
RDW: 12.3 % (ref 11.5–15.5)
WBC: 10.3 10*3/uL (ref 4.0–10.5)

## 2016-10-23 LAB — RAPID URINE DRUG SCREEN, HOSP PERFORMED
Amphetamines: NOT DETECTED
BARBITURATES: NOT DETECTED
BENZODIAZEPINES: NOT DETECTED
COCAINE: POSITIVE — AB
OPIATES: POSITIVE — AB
TETRAHYDROCANNABINOL: NOT DETECTED

## 2016-10-23 LAB — ETHANOL

## 2016-10-23 MED ORDER — HYDROXYZINE HCL 25 MG PO TABS
25.0000 mg | ORAL_TABLET | Freq: Four times a day (QID) | ORAL | Status: DC | PRN
  Administered 2016-10-24: 25 mg via ORAL
  Filled 2016-10-23: qty 1

## 2016-10-23 MED ORDER — LOPERAMIDE HCL 2 MG PO CAPS: 2.0000 mg | ORAL_CAPSULE | ORAL | Status: DC | PRN

## 2016-10-23 MED ORDER — DICYCLOMINE HCL 20 MG PO TABS
20.0000 mg | ORAL_TABLET | Freq: Four times a day (QID) | ORAL | Status: DC | PRN
  Administered 2016-10-24: 20 mg via ORAL
  Filled 2016-10-23: qty 1

## 2016-10-23 MED ORDER — METHOCARBAMOL 500 MG PO TABS
500.0000 mg | ORAL_TABLET | Freq: Three times a day (TID) | ORAL | Status: DC | PRN
  Administered 2016-10-24 (×2): 500 mg via ORAL
  Filled 2016-10-23 (×2): qty 1

## 2016-10-23 MED ORDER — ONDANSETRON 4 MG PO TBDP
4.0000 mg | ORAL_TABLET | Freq: Four times a day (QID) | ORAL | Status: DC | PRN
  Administered 2016-10-24: 4 mg via ORAL
  Filled 2016-10-23: qty 1

## 2016-10-23 NOTE — ED Provider Notes (Signed)
[facility]-EMERGENCY DEPT Provider Note   CSN: [id number] Arrival date & time: 10/23/16  1711     History   Chief Complaint Chief Complaint  Patient presents with  . detox    HPI William Bass is a 41 y.o. male.  41 year old male presents requesting detox from multiple amphetamines as well as opiates. He endorses suicidal ideations without a definitive plan. He also endorses homicidal ideations although not towards particular individual. Has attempted to seek rehabilitation to multiple sources he says unsuccessfully. He is very frustrated at this time. Denies any somatic complaints of abdominal discomfort or vomiting. Denies any tremors. No daily use of alcohol. No tobacco use prior to arrival.      Past Medical History:  Diagnosis Date  . Depression   . Heroin abuse   . Knee pain, left     Patient Active Problem List   Diagnosis Date Noted  . Opioid use disorder, moderate, dependence (HCC) 10/05/2016  . Cocaine use disorder, moderate, dependence (HCC) 10/05/2016  . Opioid abuse with opioid-induced mood disorder (HCC) 10/05/2016  . Involuntary commitment   . KNEE PAIN, LEFT 08/10/2007  . OVERWEIGHT 02/23/2007    History reviewed. No pertinent surgical history.     Home Medications    Prior to Admission medications   Medication Sig Start Date End Date Taking? Authorizing Provider  acyclovir (ZOVIRAX) 400 MG tablet Take 2.5 tablets (1,000 mg total) by mouth 3 (three) times daily. Patient not taking: Reported on 10/05/2016 08/03/15   Janne Napoleon, NP  benzonatate (TESSALON) 100 MG capsule Take 1 capsule (100 mg total) by mouth every 8 (eight) hours. Patient not taking: Reported on 10/05/2016 03/09/14   Santiago Glad, PA-C  cephALEXin (KEFLEX) 500 MG capsule Take 1 capsule (500 mg total) by mouth 4 (four) times daily. Patient not taking: Reported on 10/05/2016 02/23/15   Ward, Chase Picket, PA-C  cloNIDine (CATAPRES) 0.2 MG tablet Take 1 tablet (0.2 mg total) by mouth 3 (three)  times daily. Patient not taking: Reported on 10/05/2016 09/29/15 10/02/15  Raeford Razor, MD  dicyclomine (BENTYL) 20 MG tablet Take 1 tablet (20 mg total) by mouth every 6 (six) hours as needed for spasms. Patient not taking: Reported on 10/05/2016 09/29/15   Raeford Razor, MD  HYDROcodone-acetaminophen (NORCO/VICODIN) 5-325 MG per tablet Take 2 tablets by mouth every 4 (four) hours as needed for moderate pain or severe pain. Patient not taking: Reported on 10/05/2016 02/03/14   Joycie Peek, PA-C  hydrOXYzine (ATARAX/VISTARIL) 25 MG tablet Take 1 tablet (25 mg total) by mouth every 6 (six) hours. Patient not taking: Reported on 10/05/2016 08/03/15   Janne Napoleon, NP  ondansetron (ZOFRAN ODT) 8 MG disintegrating tablet Take 1 tablet (8 mg total) by mouth every 8 (eight) hours as needed for nausea or vomiting. Patient not taking: Reported on 10/05/2016 10/01/16   Margarita Grizzle, MD  ondansetron (ZOFRAN) 4 MG tablet Take 1 tablet (4 mg total) by mouth every 6 (six) hours. Patient not taking: Reported on 10/05/2016 09/29/15   Raeford Razor, MD  oxymetazoline [facility] NASAL SPRAY) 0.05 % nasal spray Place 1 spray into both nostrils 2 (two) times daily. Patient not taking: Reported on 10/05/2016 03/09/14   Santiago Glad, PA-C  predniSONE (STERAPRED UNI-PAK 21 TAB) 10 MG (21) TBPK tablet Take 2 tablets (20 mg total) by mouth 2 (two) times daily. Patient not taking: Reported on 10/05/2016 08/03/15   Janne Napoleon, NP    Family History History reviewed. No pertinent family history.  Social History Social History  Substance Use Topics  . Smoking status: Former Games developer  . Smokeless tobacco: Never Used  . Alcohol use No     Allergies   Ibuprofen   Review of Systems Review of Systems  All other systems reviewed and are negative.    Physical Exam Updated Vital Signs BP (!) 121/93 (BP Location: Left Arm)   Pulse 81   Temp 98.7 F (37.1 C) (Oral)   Resp 18   SpO2 98%   Physical Exam  Constitutional: He  is oriented to person, place, and time. He appears well-developed and well-nourished.  Non-toxic appearance. No distress.  HENT:  Head: Normocephalic and atraumatic.  Eyes: Pupils are equal, round, and reactive to light. Conjunctivae, EOM and lids are normal.  Neck: Normal range of motion. Neck supple. No tracheal deviation present. No thyroid mass present.  Cardiovascular: Normal rate, regular rhythm and normal heart sounds.  Exam reveals no gallop.   No murmur heard. Pulmonary/Chest: Effort normal and breath sounds normal. No stridor. No respiratory distress. He has no decreased breath sounds. He has no wheezes. He has no rhonchi. He has no rales.  Abdominal: Soft. Normal appearance and bowel sounds are normal. He exhibits no distension. There is no tenderness. There is no rebound and no CVA tenderness.  Musculoskeletal: Normal range of motion. He exhibits no edema or tenderness.  Neurological: He is alert and oriented to person, place, and time. He has normal strength. No cranial nerve deficit or sensory deficit. GCS eye subscore is 4. GCS verbal subscore is 5. GCS motor subscore is 6.  Skin: Skin is warm and dry. No abrasion and no rash noted.  Psychiatric: He has a normal mood and affect. His speech is normal and behavior is normal. He expresses homicidal and suicidal ideation.  Nursing note and vitals reviewed.    ED Treatments / Results  Labs (all labs ordered are listed, but only abnormal results are displayed) Labs Reviewed - No data to display  EKG  EKG Interpretation None       Radiology No results found.  Procedures Procedures (including critical care time)  Medications Ordered in ED Medications  dicyclomine (BENTYL) tablet 20 mg (not administered)  hydrOXYzine (ATARAX/VISTARIL) tablet 25 mg (not administered)  loperamide (IMODIUM) capsule 2-4 mg (not administered)  methocarbamol (ROBAXIN) tablet 500 mg (not administered)  ondansetron (ZOFRAN-ODT) disintegrating  tablet 4 mg (not administered)     Initial Impression / Assessment and Plan / ED Course  I have reviewed the triage vital signs and the nursing notes.  Pertinent labs & imaging results that were available during my care of the patient were reviewed by me and considered in my medical decision making (see chart for details).     Patient is medically clear for psychiatric disposition  Final Clinical Impressions(s) / ED Diagnoses   Final diagnoses:  None    New Prescriptions New Prescriptions   No medications on file     Lorre Nick, MD 10/23/16 1805

## 2016-10-23 NOTE — BH Assessment (Addendum)
Assessment Note  William Bass is an 41 y.o. male who presents voluntary to [facility] He is accompanied by his spouse.  Pt was a poor historian and his spouse assisted with providing history. Per spouse, "[facility] told me to bring him here and you guys would transport him straight to the hospital.so we don't need a TTS assessment". Writer made the spouse and patient aware of the process moving forward (TTS assessment, information shared with the [facility] psych provider for recommendations, and dispositioning". Patient and spouse were further surprised that they may have to wait in the ED for an extended amount of time.  Pt states that he is suicidal with a plan. He has intent to harm himself. He sts, "If I don't get the help I need I'm going to kill myself". He informs this Clinical research associate that he attempted suicide last night by overdosing on illicit drugs. Denies prior suicide attempts and/or gestures. Denies access to firearms. Sts, "I will just use the drugs as my weapon to kill myself". He continues to endorse present/current suicidal ideations and is unable to contract for safety. Denies self-mutilating behaviors. He has a family history of mental health illness (mother-depression). He describes his current depressive symptoms as isolating self from others, anger/irritability, loss of interest in usual pleasures, fatigue, hopelessness, and despondence.  Denies HI. He is cooperative but angry and irritable. He denies current legal charges. Patient his however on probation. Denies AVH's. He does not have a current outpatient therapist or psychiatrist. He was seen at [facility] and [facility] in the past. Last visit to [facility] was 3-4 week ago.  He has received INPT residential treatment at [facility], [facility], and [facility] in the past. Patient has a long standing use of polysubstance. He reports use of heroin, Molly, MDMA, THC, cocaine, and alcohol. See Additional Social history  section for details related to patient's substance use. He reports several withdrawal symptoms including sweats, diarrhea, vomiting, cramps, etc. He denies history of blackouts and/or seizures. Longest period of sobriety is 18 months.   Pt presents in street clothing. He is disheveled and malodourous. He has pressured speech. His affect is angry. Pt's eye contact was fair.  Pt's thought process was circumstantial. Pt's judgement was impaired. Pt's concentration, insight and impulse control are poor.   Diagnosis: Major Depressive Disorder, Single Episode, Severe, without psychotic features and Substance-Induced Mood Disorder  Past Medical History:  Past Medical History:  Diagnosis Date  . Depression   . Heroin abuse   . Knee pain, left     History reviewed. No pertinent surgical history.  Family History: History reviewed. No pertinent family history.  Social History:  reports that he has quit smoking. He has never used smokeless tobacco. He reports that he uses drugs, including Cocaine. He reports that he does not drink alcohol.  Additional Social History:  Alcohol / Drug Use Pain Medications: See MAR Prescriptions: See MAR Over the Counter: See MAR Longest period of sobriety (when/how long): 18 months  Negative Consequences of Use: Financial, Legal, Personal relationships, Work / School Withdrawal Symptoms: Agitation, Diarrhea, Sweats, Fever / Chills, Aggressive/Assaultive, Irritability, Weakness, Nausea / Vomiting, Cramps, Anorexia Substance #1 Name of Substance 1: Heroin 1 - Age of First Use: 41 years old  1 - Amount (size/oz): 3-4 grams  1 - Frequency: daily  1 - Duration: on-going  1 - Last Use / Amount: 10/22/2016 Substance #2 Name of Substance 2: Molly/MDMA 2 - Age of First Use:  41 years old  2 - Amount (size/oz): 1 line  2 - Frequency: occasional  2 - Duration: on-going  2 - Last Use / Amount: 10/22/2016 Substance #3 Name of Substance 3: Cocaine  3 - Age of First Use:  41 years old  3 - Amount (size/oz): 3-4 grams  3 - Frequency: daily  3 - Duration: on-going  3 - Last Use / Amount: 10/23/2016 Substance #4 Name of Substance 4: THC 4 - Age of First Use: 41 years old  4 - Amount (size/oz): 1 ounce per week  4 - Frequency: "I haven't really smoked recently" 4 - Duration: on-going  4 - Last Use / Amount: couple of weeks ago Substance #5 Name of Substance 5: Methadone ([facility]" 5 - Age of First Use: 41 years old  5 - Amount (size/oz): 110 mg's daily  5 - Frequency: daily  5 - Duration: 1 year  5 - Last Use / Amount: 2-3 weeks ago Substance #6 Name of Substance 6: Alcohol  6 - Age of First Use: "I was young ...by dad would give it to me in a baby bottle" 6 - Amount (size/oz): varies  6 - Frequency: "It's not really my thing" 6 - Duration: "on and off"..."occasional use" 6 - Last Use / Amount: 6-8 years ago   CIWA: CIWA-Ar BP: (!) 121/93 Pulse Rate: 81 COWS:    Allergies:  Allergies  Allergen Reactions  . Ibuprofen Swelling    Home Medications:  (Not in a hospital admission)  OB/GYN Status:  No LMP for male patient.  General Assessment Data Location of Assessment: [facility] TTS Assessment: In system Is this a Tele or Face-to-Face Assessment?: Face-to-Face Is this an Initial Assessment or a Re-assessment for this encounter?: Initial Assessment Marital status: Married [place] name:  (n/a) Is patient pregnant?: No Pregnancy Status: No Living Arrangements: Other (Comment) ("I've been staying in the streets for the past 3 days") Can pt return to current living arrangement?: No Admission Status: Voluntary Is patient capable of signing voluntary admission?: Yes Referral Source: Self/Family/Friend Insurance type:  (self pay )     Crisis Care Plan Living Arrangements: Other (Comment) ("I've been staying in the streets for the past 3 days") Legal Guardian: Other: (no legal guardian ) Name of Psychiatrist: NA Name of  Therapist: NA  Education Status Current Grade:  (n/a) Highest grade of school patient has completed:  (11th grade) Name of school:  (n/a) Contact person:  (n/a)  Risk to self with the past 6 months Suicidal Ideation: Yes-Currently Present Has patient been a risk to self within the past 6 months prior to admission? : Yes Suicidal Intent: Yes-Currently Present Has patient had any suicidal intent within the past 6 months prior to admission? : Yes Is patient at risk for suicide?: Yes Suicidal Plan?: Yes-Currently Present (overdose) Has patient had any suicidal plan within the past 6 months prior to admission? : Yes Specify Current Suicidal Plan:  (overdose) Access to Means: Yes Specify Access to Suicidal Means:  (drugs ) What has been your use of drugs/alcohol within the last 12 months?:  (polysubstance abuse ) Previous Attempts/Gestures: No How many times?:  (0) Other Self Harm Risks:  (denies ) Triggers for Past Attempts: Other (Comment) (no prior attempts or gestures ) Intentional Self Injurious Behavior: None Family Suicide History: Yes (mother-depression ) Recent stressful life event(s):  ("I can't get off drugs") Persecutory voices/beliefs?: No Depression: Yes Depression Symptoms: Feeling angry/irritable, Loss of interest in usual pleasures,  Feeling worthless/self pity, Guilt, Fatigue, Isolating Substance abuse history and/or treatment for substance abuse?: Yes Suicide prevention information given to non-admitted patients: Not applicable  Risk to Others within the past 6 months Homicidal Ideation: No Does patient have any lifetime risk of violence toward others beyond the six months prior to admission? : No Thoughts of Harm to Others: No Current Homicidal Intent: No Current Homicidal Plan: No Access to Homicidal Means: No Identified Victim:  (n/a) History of harm to others?: No Assessment of Violence: None Noted Violent Behavior Description:  (currently cooperative;  irritable, angry ) Does patient have access to weapons?: No Criminal Charges Pending?: No Does patient have a court date: No Is patient on probation?: Yes (unsupervised probation )  Psychosis Hallucinations: None noted Delusions: None noted  Mental Status Report Appearance/Hygiene: Poor hygiene, Other (Comment) (in street clothing ) Eye Contact: Fair Motor Activity: Agitation, Restlessness Speech: Aggressive, Pressured Level of Consciousness: Alert, Restless Mood: Anxious Affect: Angry, Irritable Anxiety Level: Severe Thought Processes: Relevant Judgement: Impaired Orientation: Person, Place, Situation, Time Obsessive Compulsive Thoughts/Behaviors: Unable to Assess  Cognitive Functioning Concentration: Poor Memory: Remote Intact, Recent Intact IQ: Average Insight: Poor Impulse Control: Poor Appetite: Poor Weight Loss:  (20-30 pounds in the past couple months ) Weight Gain:  (denies ) Sleep: Decreased Total Hours of Sleep:  ("I don't get any sleep because I sleep outside") Vegetative Symptoms: Unable to Assess  ADLScreening [facility] Assessment Services) Patient's cognitive ability adequate to safely complete daily activities?: Yes Patient able to express need for assistance with ADLs?: Yes Independently performs ADLs?: Yes (appropriate for developmental age)  Prior Inpatient Therapy Prior Inpatient Therapy: Yes Prior Therapy Dates:  ("I can't remember") Prior Therapy Facilty/Provider(s):  ([facility], [facility], [facility], [facility]) Reason for Treatment:  (substance abuse )  Prior Outpatient Therapy Prior Outpatient Therapy: Yes Prior Therapy Dates:  (3-4 weeks ago for 1 year ) Prior Therapy Facilty/Provider(s):  ([facility] ) Reason for Treatment:  (substance abuse/Methadone ) Does patient have an ACCT team?: No Does patient have Intensive In-House Services?  : No Does patient have [facility] services? : No Does patient have P4CC services?: No  ADL  Screening (condition at time of admission) Patient's cognitive ability adequate to safely complete daily activities?: Yes Is the patient deaf or have difficulty hearing?: No Does the patient have difficulty seeing, even when wearing glasses/contacts?: No Does the patient have difficulty concentrating, remembering, or making decisions?: No Patient able to express need for assistance with ADLs?: Yes Does the patient have difficulty dressing or bathing?: No Independently performs ADLs?: Yes (appropriate for developmental age) Does the patient have difficulty walking or climbing stairs?: No Weakness of Legs: None Weakness of Arms/Hands: None  Home Assistive Devices/Equipment Home Assistive Devices/Equipment: None    Abuse/Neglect Assessment (Assessment to be complete while patient is alone) Physical Abuse: Denies Verbal Abuse: Denies Sexual Abuse: Denies Exploitation of patient/patient's resources: Denies Self-Neglect: Denies     Merchant navy officer (For Healthcare) Does Patient Have a Medical Advance Directive?: No Would patient like information on creating a medical advance directive?: No - Patient declined Nutrition Screen- [facility] Patient's home diet: Regular  Additional Information 1:1 In Past 12 Months?: No CIRT Risk: No Elopement Risk: No Does patient have medical clearance?: No     Disposition: Disposition pending. No provider available to provide recommendations for patient's disposition. On-coming staff will need to discuss clinicals with on-coming provider.   Disposition Initial Assessment Completed for this Encounter: Yes  On Site Evaluation  by:   Reviewed with Physician:    Melynda Ripple 10/23/2016 6:12 PM

## 2016-10-23 NOTE — BHH Counselor (Signed)
Disposition pending. No provider available to provide recommendations for patient's disposition. On-coming staff will need to discuss clinicals with on-coming provider.

## 2016-10-23 NOTE — BHH Counselor (Addendum)
Clinician discussed pt's  disposition (Per Nira Conn, NP recommends inpatient treatment, TTS to seek placement) with Dr. Madilyn Hook.    Redmond Pulling, MS, [facility], [facility] Triage Specialist [phone]

## 2016-10-23 NOTE — ED Triage Notes (Signed)
Pt reports he is going to kill himself or hurt others if he cannot get placed for detox. Pt reports he uses heroin, molly, methadone, and cocaine. Last use was earlier today. Does not use etoh.

## 2016-10-23 NOTE — ED Notes (Signed)
ED Provider at bedside. 

## 2016-10-24 DIAGNOSIS — F1420 Cocaine dependence, uncomplicated: Secondary | ICD-10-CM

## 2016-10-24 DIAGNOSIS — F1114 Opioid abuse with opioid-induced mood disorder: Secondary | ICD-10-CM

## 2016-10-24 DIAGNOSIS — Z87891 Personal history of nicotine dependence: Secondary | ICD-10-CM

## 2016-10-24 DIAGNOSIS — F1910 Other psychoactive substance abuse, uncomplicated: Secondary | ICD-10-CM

## 2016-10-24 MED ORDER — CLONIDINE HCL 0.1 MG PO TABS: 0.1000 mg | ORAL_TABLET | Freq: Every day | ORAL | Status: DC

## 2016-10-24 MED ORDER — GABAPENTIN 100 MG PO CAPS
200.0000 mg | ORAL_CAPSULE | Freq: Two times a day (BID) | ORAL | Status: DC
  Administered 2016-10-24 – 2016-10-25 (×3): 200 mg via ORAL
  Filled 2016-10-24 (×3): qty 2

## 2016-10-24 MED ORDER — CLONIDINE HCL 0.1 MG PO TABS
0.1000 mg | ORAL_TABLET | Freq: Four times a day (QID) | ORAL | Status: DC
  Administered 2016-10-24 – 2016-10-25 (×5): 0.1 mg via ORAL
  Filled 2016-10-24 (×5): qty 1

## 2016-10-24 MED ORDER — CLONIDINE HCL 0.1 MG PO TABS: 0.1000 mg | ORAL_TABLET | ORAL | Status: DC

## 2016-10-24 NOTE — ED Notes (Signed)
Peer support at bedside 

## 2016-10-24 NOTE — ED Notes (Signed)
Bed: WA30 Expected date:  Expected time:  Means of arrival:  Comments: 

## 2016-10-24 NOTE — ED Notes (Signed)
Pt admitted to room #42. Pt reports being "tired of addiction." Pt endorsing SI-verbally contracts for safety. Pt endorsing AH. Pt denies HI/VH. Encouragement and support provided. Special checks q 15 mins in place for safety, Video monitoring in place. Will continue to monitor.

## 2016-10-24 NOTE — ED Notes (Signed)
Bed: WBH42 Expected date:  Expected time:  Means of arrival:  Comments: 30 

## 2016-10-24 NOTE — BHH Counselor (Signed)
Per Dr. Jannifer Franklin and Nanine Means, DNP, 24 observation.

## 2016-10-25 MED ORDER — GABAPENTIN 100 MG PO CAPS: 200.0000 mg | ORAL_CAPSULE | Freq: Two times a day (BID) | ORAL | 0 refills | Status: AC

## 2016-10-25 NOTE — ED Notes (Signed)
Pt d/c home per MD order. Discharge summary reviewed with pt. Pt uninterested. Pt not endorsing SI/HI/AVH. Pt signed e-signature. Personal property returned to pt. Pt ambulatory off unit with [facility].

## 2016-10-25 NOTE — Consult Note (Signed)
[facility] Consult   Reason for Consult:  Substance abuse Referring Physician:  EDP Patient Identification: William Bass MRN:  [id number] Principal Diagnosis: Opioid abuse with opioid-induced mood disorder [facility]) Diagnosis:   Patient Active Problem List   Diagnosis Date Noted  . Opioid abuse with opioid-induced mood disorder ([place]) [F11.14] 10/05/2016    Priority: High  . Opioid use disorder, moderate, dependence ([place]) [F11.20] 10/05/2016  . Cocaine use disorder, moderate, dependence ([place]) [F14.20] 10/05/2016  . Involuntary commitment [Z04.6]   . KNEE PAIN, LEFT [M25.569] 08/10/2007  . OVERWEIGHT [E66.3] 02/23/2007    Total Time spent with patient: 45 minutes  Subjective:   William Bass is a 41 y.o. male patient will be monitored for 24 hours.  HPI:  41 yo male who presented to the ED with abuse of cocaine and meth.  He was kept to stabilized/detox off the drugs.  He is still high on drugs and will be kept overnight for stabilization.  Denies suicidal/homicidal ideations and hallucinations.  Peer support referral in place.  Past Psychiatric History: substance abuse  Risk to Self:None Risk to Others: Homicidal Ideation: No Thoughts of Harm to Others: No Current Homicidal Intent: No Current Homicidal Plan: No Access to Homicidal Means: No Identified Victim:  (n/a) History of harm to others?: No Assessment of Violence: None Noted Violent Behavior Description:  (currently cooperative; irritable, angry ) Does patient have access to weapons?: No Criminal Charges Pending?: No Does patient have a court date: No Prior Inpatient Therapy: Prior Inpatient Therapy: Yes Prior Therapy Dates:  ("I can't remember") Prior Therapy Facilty/Provider(s):  ([facility], [facility], [facility], [facility]) Reason for Treatment:  (substance abuse ) Prior Outpatient Therapy: Prior Outpatient Therapy: Yes Prior Therapy Dates:  (3-4 weeks ago for 1 year ) Prior Therapy Facilty/Provider(s):   ([facility] ) Reason for Treatment:  (substance abuse/Methadone ) Does patient have an ACCT team?: No Does patient have Intensive In-House Services?  : No Does patient have [facility] services? : No Does patient have P4CC services?: No  Past Medical History:  Past Medical History:  Diagnosis Date  . Depression   . Heroin abuse   . Knee pain, left    History reviewed. No pertinent surgical history. Family History: History reviewed. No pertinent family history. Family Psychiatric  History: unknown Social History:  History  Alcohol Use No     History  Drug Use  . Types: Cocaine    Social History   Social History  . Marital status: Single    Spouse name: N/A  . Number of children: N/A  . Years of education: N/A   Social History Main Topics  . Smoking status: Former Research scientist (life sciences)  . Smokeless tobacco: Never Used  . Alcohol use No  . Drug use: Yes    Types: Cocaine  . Sexual activity: Not Asked     Comment: Heroine   Other Topics Concern  . None   Social History Narrative  . None   Additional Social History:    Allergies:   Allergies  Allergen Reactions  . Ibuprofen Swelling    Labs:  Results for orders placed or performed during the hospital encounter of 10/23/16 (from the past 48 hour(s))  Ethanol     Status: None   Collection Time: 10/23/16  6:35 PM  Result Value Ref Range   Alcohol, Ethyl (B) <5 <5 mg/dL    Comment:        LOWEST DETECTABLE LIMIT FOR SERUM ALCOHOL IS 5 mg/dL FOR  MEDICAL PURPOSES ONLY   CBC with Differential/Platelet     Status: None   Collection Time: 10/23/16  6:35 PM  Result Value Ref Range   WBC 10.3 4.0 - 10.5 K/uL   RBC 5.46 4.22 - 5.81 MIL/uL   Hemoglobin 16.3 13.0 - 17.0 g/dL   HCT 47.4 39.0 - 52.0 %   MCV 86.8 78.0 - 100.0 fL   MCH 29.9 26.0 - 34.0 pg   MCHC 34.4 30.0 - 36.0 g/dL   RDW 12.3 11.5 - 15.5 %   Platelets 188 150 - 400 K/uL   Neutrophils Relative % 65 %   Neutro Abs 6.8 1.7 - 7.7 K/uL    Lymphocytes Relative 24 %   Lymphs Abs 2.4 0.7 - 4.0 K/uL   Monocytes Relative 10 %   Monocytes Absolute 1.0 0.1 - 1.0 K/uL   Eosinophils Relative 1 %   Eosinophils Absolute 0.1 0.0 - 0.7 K/uL   Basophils Relative 0 %   Basophils Absolute 0.0 0.0 - 0.1 K/uL  Comprehensive metabolic panel     Status: Abnormal   Collection Time: 10/23/16  6:35 PM  Result Value Ref Range   Sodium 137 135 - 145 mmol/L   Potassium 3.4 (L) 3.5 - 5.1 mmol/L   Chloride 98 (L) 101 - 111 mmol/L   CO2 27 22 - 32 mmol/L   Glucose, Bld 105 (H) 65 - 99 mg/dL   BUN 9 6 - 20 mg/dL   Creatinine, Ser 1.05 0.61 - 1.24 mg/dL   Calcium 8.9 8.9 - 10.3 mg/dL   Total Protein 6.2 (L) 6.5 - 8.1 g/dL   Albumin 3.8 3.5 - 5.0 g/dL   AST 17 15 - 41 U/L   ALT 18 17 - 63 U/L   Alkaline Phosphatase 45 38 - 126 U/L   Total Bilirubin 1.3 (H) 0.3 - 1.2 mg/dL   GFR calc non Af Amer >60 >60 mL/min   GFR calc Af Amer >60 >60 mL/min    Comment: (NOTE) The eGFR has been calculated using the CKD EPI equation. This calculation has not been validated in all clinical situations. eGFR's persistently <60 mL/min signify possible Chronic Kidney Disease.    Anion gap 12 5 - 15  Rapid urine drug screen (hospital performed)     Status: Abnormal   Collection Time: 10/23/16  7:28 PM  Result Value Ref Range   Opiates POSITIVE (A) NONE DETECTED   Cocaine POSITIVE (A) NONE DETECTED   Benzodiazepines NONE DETECTED NONE DETECTED   Amphetamines NONE DETECTED NONE DETECTED   Tetrahydrocannabinol NONE DETECTED NONE DETECTED   Barbiturates NONE DETECTED NONE DETECTED    Comment:        DRUG SCREEN FOR MEDICAL PURPOSES ONLY.  IF CONFIRMATION IS NEEDED FOR ANY PURPOSE, NOTIFY LAB WITHIN 5 DAYS.        LOWEST DETECTABLE LIMITS FOR URINE DRUG SCREEN Drug Class       Cutoff (ng/mL) Amphetamine      1000 Barbiturate      200 Benzodiazepine   062 Tricyclics       694 Opiates          300 Cocaine          300 THC              50     Current  Facility-Administered Medications  Medication Dose Route Frequency Provider Last Rate Last Dose  . cloNIDine (CATAPRES) tablet 0.1 mg  0.1 mg Oral QID Tymeer Vaquera,  MD   0.1 mg at 10/25/16 0924   Followed by  . [START ON 10/26/2016] cloNIDine (CATAPRES) tablet 0.1 mg  0.1 mg Oral BH-qamhs [place], MD       Followed by  . [START ON 10/28/2016] cloNIDine (CATAPRES) tablet 0.1 mg  0.1 mg Oral QAC breakfast Flora Ratz, MD      . dicyclomine (BENTYL) tablet 20 mg  20 mg Oral Q6H PRN Lacretia Leigh, MD   20 mg at 10/24/16 2154  . gabapentin (NEURONTIN) capsule 200 mg  200 mg Oral BID Bedelia Pong, MD   200 mg at 10/25/16 0925  . hydrOXYzine (ATARAX/VISTARIL) tablet 25 mg  25 mg Oral Q6H PRN Lacretia Leigh, MD   25 mg at 10/24/16 2154  . loperamide (IMODIUM) capsule 2-4 mg  2-4 mg Oral PRN Lacretia Leigh, MD      . methocarbamol (ROBAXIN) tablet 500 mg  500 mg Oral Q8H PRN Lacretia Leigh, MD   500 mg at 10/24/16 2154  . ondansetron (ZOFRAN-ODT) disintegrating tablet 4 mg  4 mg Oral Q6H PRN Lacretia Leigh, MD   4 mg at 10/24/16 2206   Current Outpatient Prescriptions  Medication Sig Dispense Refill  . cloNIDine (CATAPRES) 0.2 MG tablet Take 1 tablet (0.2 mg total) by mouth 3 (three) times daily. (Patient not taking: Reported on 10/05/2016) 9 tablet 0  . gabapentin (NEURONTIN) 100 MG capsule Take 2 capsules (200 mg total) by mouth 2 (two) times daily. 60 capsule 0  . hydrOXYzine (ATARAX/VISTARIL) 25 MG tablet Take 1 tablet (25 mg total) by mouth every 6 (six) hours. (Patient not taking: Reported on 10/05/2016) 20 tablet 0  . ondansetron (ZOFRAN ODT) 8 MG disintegrating tablet Take 1 tablet (8 mg total) by mouth every 8 (eight) hours as needed for nausea or vomiting. (Patient not taking: Reported on 10/05/2016) 20 tablet 0    Musculoskeletal: Strength & Muscle Tone: within normal limits Gait & Station: normal Patient leans: N/A  Psychiatric Specialty Exam: Physical Exam   Constitutional: He is oriented to person, place, and time. He appears well-developed and well-nourished.  HENT:  Head: Normocephalic.  Neck: Normal range of motion.  Respiratory: Effort normal.  Musculoskeletal: Normal range of motion.  Neurological: He is alert and oriented to person, place, and time.  Psychiatric: He has a normal mood and affect. His speech is normal and behavior is normal. Judgment and thought content normal. Cognition and memory are normal.    Review of Systems  Psychiatric/Behavioral: Positive for substance abuse.  All other systems reviewed and are negative.   Blood pressure 104/63, pulse 68, temperature 98.4 F (36.9 C), temperature source Oral, resp. rate 16, SpO2 100 %.There is no height or weight on file to calculate BMI.  General Appearance: Casual  Eye Contact:  Good  Speech:  Clear and Coherent  Volume:  Normal  Mood:  Euthymic  Affect:  Congruent  Thought Process:  Coherent and Descriptions of Associations: Intact  Orientation:  Full (Time, Place, and Person)  Thought Content:  WDL and Logical  Suicidal Thoughts:  No  Homicidal Thoughts:  No  Memory:  Immediate;   Good Recent;   Good Remote;   Good  Judgement:  Fair  Insight:  Fair  Psychomotor Activity:  Normal  Concentration:  Concentration: Good and Attention Span: Good  Recall:  Good  Fund of Knowledge:  Fair  Language:  Good  Akathisia:  No  Handed:  Right  AIMS (if indicated):     Assets:  Leisure Time Physical Health Resilience Social Support  ADL's:  Intact  Cognition:  WNL  Sleep:        Treatment Plan Summary: Daily contact with patient to assess and evaluate symptoms and progress in treatment, Medication management and Plan substance induced mood disorder:  -Crisis stabilization -Medication management:  Started Clonidine opiate withdrawal protocol started along with Gabapentin 200 mg BID for withdrawal symptoms. -Individual counseling -Peer support referral    Disposition: 24 hour monitoring  LORD, Theodoro Clock, NP 10/25/2016 12:06 PM  Patient seen face-to-face for psychiatric evaluation, chart reviewed and case discussed with the physician extender and developed treatment plan. Reviewed the information documented and agree with the treatment plan. Corena Pilgrim, MD

## 2016-10-25 NOTE — BH Assessment (Signed)
BHH Assessment Progress Note  Per Thedore Mins, MD, this pt does not require psychiatric hospitalization at this time.  Pt is to be discharged from [facility] with referral information for area substance abuse treatment providers.  This has been included in pt's discharge instructions.  Pt's nurse, Morrie Sheldon, has been notified.  She informs this Clinical research associate that pt may have already been [facility] to [facility] or to [facility].  At 11:35 I called [facility].  They report that they had assigned a bed for the pt on 10/23/2016, but that that bed is no longer available.  Morrie Sheldon has been informed.  Doylene Canning, MA Triage Specialist [phone]

## 2016-10-25 NOTE — Progress Notes (Signed)
Pt is irritable and needy.  Pt sts he has stomach ache, wants something for sleep and something to "knock him out".  Pt denies SI, HI and AVH.  Pt agrees to verbally contract for safety.  Pt watching TV and refuses any snacks.

## 2016-10-25 NOTE — Consult Note (Signed)
[facility] Consult   Reason for Consult:  Substance abuse Referring Physician:  EDP Patient Identification: William Bass MRN:  [id number] Principal Diagnosis: Opioid abuse with opioid-induced mood disorder [facility]) Diagnosis:   Patient Active Problem List   Diagnosis Date Noted  . Opioid abuse with opioid-induced mood disorder ([place]) [F11.14] 10/05/2016    Priority: High  . Opioid use disorder, moderate, dependence ([place]) [F11.20] 10/05/2016  . Cocaine use disorder, moderate, dependence ([place]) [F14.20] 10/05/2016  . Involuntary commitment [Z04.6]   . KNEE PAIN, LEFT [M25.569] 08/10/2007  . OVERWEIGHT [E66.3] 02/23/2007    Total Time spent with patient: 30 minutes  Subjective:   William Bass is a 41 y.o. male patient has stabilized.  HPI:  41 yo male who presented to the ED with abuse of cocaine and meth.  He was kept to stabilized/detox off the drugs.  He slept and stabilized.  Denies suicidal/homicidal ideations, hallucinations, and withdrawal symptoms.  Peer support provided resources for follow-up for substance abuse.  Past Psychiatric History: substance abuse  Risk to Self:None Risk to Others: Homicidal Ideation: No Thoughts of Harm to Others: No Current Homicidal Intent: No Current Homicidal Plan: No Access to Homicidal Means: No Identified Victim:  (n/a) History of harm to others?: No Assessment of Violence: None Noted Violent Behavior Description:  (currently cooperative; irritable, angry ) Does patient have access to weapons?: No Criminal Charges Pending?: No Does patient have a court date: No Prior Inpatient Therapy: Prior Inpatient Therapy: Yes Prior Therapy Dates:  ("I can't remember") Prior Therapy Facilty/Provider(s):  ([facility], [facility], [facility], [facility]) Reason for Treatment:  (substance abuse ) Prior Outpatient Therapy: Prior Outpatient Therapy: Yes Prior Therapy Dates:  (3-4 weeks ago for 1 year ) Prior Therapy Facilty/Provider(s):   ([facility] ) Reason for Treatment:  (substance abuse/Methadone ) Does patient have an ACCT team?: No Does patient have Intensive In-House Services?  : No Does patient have [facility] services? : No Does patient have P4CC services?: No  Past Medical History:  Past Medical History:  Diagnosis Date  . Depression   . Heroin abuse   . Knee pain, left    History reviewed. No pertinent surgical history. Family History: History reviewed. No pertinent family history. Family Psychiatric  History: unknown Social History:  History  Alcohol Use No     History  Drug Use  . Types: Cocaine    Social History   Social History  . Marital status: Single    Spouse name: N/A  . Number of children: N/A  . Years of education: N/A   Social History Main Topics  . Smoking status: Former Research scientist (life sciences)  . Smokeless tobacco: Never Used  . Alcohol use No  . Drug use: Yes    Types: Cocaine  . Sexual activity: Not Asked     Comment: Heroine   Other Topics Concern  . None   Social History Narrative  . None   Additional Social History:    Allergies:   Allergies  Allergen Reactions  . Ibuprofen Swelling    Labs:  Results for orders placed or performed during the hospital encounter of 10/23/16 (from the past 48 hour(s))  Ethanol     Status: None   Collection Time: 10/23/16  6:35 PM  Result Value Ref Range   Alcohol, Ethyl (B) <5 <5 mg/dL    Comment:        LOWEST DETECTABLE LIMIT FOR SERUM ALCOHOL IS 5 mg/dL FOR MEDICAL PURPOSES ONLY   CBC with  Differential/Platelet     Status: None   Collection Time: 10/23/16  6:35 PM  Result Value Ref Range   WBC 10.3 4.0 - 10.5 K/uL   RBC 5.46 4.22 - 5.81 MIL/uL   Hemoglobin 16.3 13.0 - 17.0 g/dL   HCT 47.4 39.0 - 52.0 %   MCV 86.8 78.0 - 100.0 fL   MCH 29.9 26.0 - 34.0 pg   MCHC 34.4 30.0 - 36.0 g/dL   RDW 12.3 11.5 - 15.5 %   Platelets 188 150 - 400 K/uL   Neutrophils Relative % 65 %   Neutro Abs 6.8 1.7 - 7.7 K/uL    Lymphocytes Relative 24 %   Lymphs Abs 2.4 0.7 - 4.0 K/uL   Monocytes Relative 10 %   Monocytes Absolute 1.0 0.1 - 1.0 K/uL   Eosinophils Relative 1 %   Eosinophils Absolute 0.1 0.0 - 0.7 K/uL   Basophils Relative 0 %   Basophils Absolute 0.0 0.0 - 0.1 K/uL  Comprehensive metabolic panel     Status: Abnormal   Collection Time: 10/23/16  6:35 PM  Result Value Ref Range   Sodium 137 135 - 145 mmol/L   Potassium 3.4 (L) 3.5 - 5.1 mmol/L   Chloride 98 (L) 101 - 111 mmol/L   CO2 27 22 - 32 mmol/L   Glucose, Bld 105 (H) 65 - 99 mg/dL   BUN 9 6 - 20 mg/dL   Creatinine, Ser 1.05 0.61 - 1.24 mg/dL   Calcium 8.9 8.9 - 10.3 mg/dL   Total Protein 6.2 (L) 6.5 - 8.1 g/dL   Albumin 3.8 3.5 - 5.0 g/dL   AST 17 15 - 41 U/L   ALT 18 17 - 63 U/L   Alkaline Phosphatase 45 38 - 126 U/L   Total Bilirubin 1.3 (H) 0.3 - 1.2 mg/dL   GFR calc non Af Amer >60 >60 mL/min   GFR calc Af Amer >60 >60 mL/min    Comment: (NOTE) The eGFR has been calculated using the CKD EPI equation. This calculation has not been validated in all clinical situations. eGFR's persistently <60 mL/min signify possible Chronic Kidney Disease.    Anion gap 12 5 - 15  Rapid urine drug screen (hospital performed)     Status: Abnormal   Collection Time: 10/23/16  7:28 PM  Result Value Ref Range   Opiates POSITIVE (A) NONE DETECTED   Cocaine POSITIVE (A) NONE DETECTED   Benzodiazepines NONE DETECTED NONE DETECTED   Amphetamines NONE DETECTED NONE DETECTED   Tetrahydrocannabinol NONE DETECTED NONE DETECTED   Barbiturates NONE DETECTED NONE DETECTED    Comment:        DRUG SCREEN FOR MEDICAL PURPOSES ONLY.  IF CONFIRMATION IS NEEDED FOR ANY PURPOSE, NOTIFY LAB WITHIN 5 DAYS.        LOWEST DETECTABLE LIMITS FOR URINE DRUG SCREEN Drug Class       Cutoff (ng/mL) Amphetamine      1000 Barbiturate      200 Benzodiazepine   981 Tricyclics       191 Opiates          300 Cocaine          300 THC              50     Current  Facility-Administered Medications  Medication Dose Route Frequency Provider Last Rate Last Dose  . cloNIDine (CATAPRES) tablet 0.1 mg  0.1 mg Oral QID Darleene Cleaver, Paco Cislo, MD   0.1 mg at 10/24/16  2200   Followed by  . [START ON 10/26/2016] cloNIDine (CATAPRES) tablet 0.1 mg  0.1 mg Oral BH-qamhs Versie Fleener, MD       Followed by  . [START ON 10/28/2016] cloNIDine (CATAPRES) tablet 0.1 mg  0.1 mg Oral QAC breakfast Emmelina Mcloughlin, MD      . dicyclomine (BENTYL) tablet 20 mg  20 mg Oral Q6H PRN Lacretia Leigh, MD   20 mg at 10/24/16 2154  . gabapentin (NEURONTIN) capsule 200 mg  200 mg Oral BID Darleene Cleaver, Cher Franzoni, MD   200 mg at 10/24/16 2153  . hydrOXYzine (ATARAX/VISTARIL) tablet 25 mg  25 mg Oral Q6H PRN Lacretia Leigh, MD   25 mg at 10/24/16 2154  . loperamide (IMODIUM) capsule 2-4 mg  2-4 mg Oral PRN Lacretia Leigh, MD      . methocarbamol (ROBAXIN) tablet 500 mg  500 mg Oral Q8H PRN Lacretia Leigh, MD   500 mg at 10/24/16 2154  . ondansetron (ZOFRAN-ODT) disintegrating tablet 4 mg  4 mg Oral Q6H PRN Lacretia Leigh, MD   4 mg at 10/24/16 2206   Current Outpatient Prescriptions  Medication Sig Dispense Refill  . cloNIDine (CATAPRES) 0.2 MG tablet Take 1 tablet (0.2 mg total) by mouth 3 (three) times daily. (Patient not taking: Reported on 10/05/2016) 9 tablet 0  . hydrOXYzine (ATARAX/VISTARIL) 25 MG tablet Take 1 tablet (25 mg total) by mouth every 6 (six) hours. (Patient not taking: Reported on 10/05/2016) 20 tablet 0  . ondansetron (ZOFRAN ODT) 8 MG disintegrating tablet Take 1 tablet (8 mg total) by mouth every 8 (eight) hours as needed for nausea or vomiting. (Patient not taking: Reported on 10/05/2016) 20 tablet 0    Musculoskeletal: Strength & Muscle Tone: within normal limits Gait & Station: normal Patient leans: N/A  Psychiatric Specialty Exam: Physical Exam  Constitutional: He is oriented to person, place, and time. He appears well-developed and well-nourished.  HENT:  Head:  Normocephalic.  Neck: Normal range of motion.  Respiratory: Effort normal.  Musculoskeletal: Normal range of motion.  Neurological: He is alert and oriented to person, place, and time.  Psychiatric: He has a normal mood and affect. His speech is normal and behavior is normal. Judgment and thought content normal. Cognition and memory are normal.    Review of Systems  Psychiatric/Behavioral: Positive for substance abuse.  All other systems reviewed and are negative.   Blood pressure (!) 89/58, pulse 66, temperature 98.4 F (36.9 C), temperature source Oral, resp. rate 16, SpO2 100 %.There is no height or weight on file to calculate BMI.  General Appearance: Casual  Eye Contact:  Good  Speech:  Clear and Coherent  Volume:  Normal  Mood:  Euthymic  Affect:  Congruent  Thought Process:  Coherent and Descriptions of Associations: Intact  Orientation:  Full (Time, Place, and Person)  Thought Content:  WDL and Logical  Suicidal Thoughts:  No  Homicidal Thoughts:  No  Memory:  Immediate;   Good Recent;   Good Remote;   Good  Judgement:  Fair  Insight:  Fair  Psychomotor Activity:  Normal  Concentration:  Concentration: Good and Attention Span: Good  Recall:  Good  Fund of Knowledge:  Fair  Language:  Good  Akathisia:  No  Handed:  Right  AIMS (if indicated):     Assets:  Leisure Time Physical Health Resilience Social Support  ADL's:  Intact  Cognition:  WNL  Sleep:        Treatment Plan Summary:  Daily contact with patient to assess and evaluate symptoms and progress in treatment, Medication management and Plan substance induced mood disorder:  -Crisis stabilization -Medication management:  Clonidine opiate withdrawal protocol started along with Gabapentin 200 mg BID for withdrawal symptoms. -Individual counseling -Peer support referral with outpatient resources provided  Disposition: No evidence of imminent risk to self or others at present.    Waylan Boga,  NP 10/25/2016 9:08 AM  Patient seen face-to-face for psychiatric evaluation, chart reviewed and case discussed with the physician extender and developed treatment plan. Reviewed the information documented and agree with the treatment plan. Corena Pilgrim, MD

## 2016-10-25 NOTE — Discharge Instructions (Signed)
To help you maintain a sober lifestyle, a substance abuse treatment program may be beneficial to you.  Contact one of the following facilities at your earliest opportunity to ask about enrolling:  RESIDENTIAL PROGRAMS:  These facilities do not accept private insurance:       [facility]      [address] [place], [place] [address]      [phone]       [facility]      [address] [place], [place] [address]      [phone]       [facility]      [address]      [place], [place] [address]      [phone]  These facilities accept private insurance:       [facility]      [address]      [place], [place] [address]      [phone]       [facility]      [address], [place] [address]      [phone]  OUTPATIENT PROGRAMS:       [facility]      [address], [place] [address]      [phone]      New patients are seen at the walk-in clinic every Tuesday from 9:00 am - 12:00 pm       [facility]      [address] [place], [place] [address]      [phone]

## 2016-10-25 NOTE — [facility] Suicide Risk Assessment (Addendum)
Suicide Risk Assessment  Discharge Assessment   [facility] Discharge Suicide Risk Assessment   Principal Problem: Opioid abuse with opioid-induced mood disorder [facility]) Discharge Diagnoses:  Patient Active Problem List   Diagnosis Date Noted  . Opioid abuse with opioid-induced mood disorder ([facility]) [F11.14] 10/05/2016    Priority: High  . Opioid use disorder, moderate, dependence ([facility]) [F11.20] 10/05/2016  . Cocaine use disorder, moderate, dependence ([facility]) [F14.20] 10/05/2016  . Involuntary commitment [Z04.6]   . KNEE PAIN, LEFT [M25.569] 08/10/2007  . OVERWEIGHT [E66.3] 02/23/2007    Total Time spent with patient: 45 minutes  Musculoskeletal: Strength & Muscle Tone: within normal limits Gait & Station: normal Patient leans: N/A  Psychiatric Specialty Exam: Physical Exam  Constitutional: He is oriented to person, place, and time. He appears well-developed and well-nourished.  HENT:  Head: Normocephalic.  Neck: Normal range of motion.  Respiratory: Effort normal.  Musculoskeletal: Normal range of motion.  Neurological: He is alert and oriented to person, place, and time.  Psychiatric: He has a normal mood and affect. His speech is normal and behavior is normal. Judgment and thought content normal. Cognition and memory are normal.    Review of Systems  Psychiatric/Behavioral: Positive for substance abuse.  All other systems reviewed and are negative.   Blood pressure (!) 89/58, pulse 66, temperature 98.4 F (36.9 C), temperature source Oral, resp. rate 16, SpO2 100 %.There is no height or weight on file to calculate BMI.  General Appearance: Casual  Eye Contact:  Good  Speech:  Clear and Coherent  Volume:  Normal  Mood:  Euthymic  Affect:  Congruent  Thought Process:  Coherent and Descriptions of Associations: Intact  Orientation:  Full (Time, Place, and Person)  Thought Content:  WDL and Logical  Suicidal Thoughts:  No  Homicidal Thoughts:  No  Memory:  Immediate;    Good Recent;   Good Remote;   Good  Judgement:  Fair  Insight:  Fair  Psychomotor Activity:  Normal  Concentration:  Concentration: Good and Attention Span: Good  Recall:  Good  Fund of Knowledge:  Fair  Language:  Good  Akathisia:  No  Handed:  Right  AIMS (if indicated):     Assets:  Leisure Time Physical Health Resilience Social Support  ADL's:  Intact  Cognition:  WNL  Sleep:       Mental Status Per Nursing Assessment::   On Admission:   substance abuse   Demographic Factors:  Male  Loss Factors: NA  Historical Factors: NA  Risk Reduction Factors:   Sense of responsibility to family and Positive social support  Continued Clinical Symptoms:  None   Cognitive Features That Contribute To Risk:  None    Suicide Risk:  Minimal: No identifiable suicidal ideation.  Patients presenting with no risk factors but with morbid ruminations; may be classified as minimal risk based on the severity of the depressive symptoms    Plan Of Care/Follow-up recommendations:  Activity:  as toleated Diet:  heart healthy diet  LORD, JAMISON, NP 10/25/2016, 10:25 AM

## 2018-04-01 ENCOUNTER — Emergency Department (HOSPITAL_BASED_OUTPATIENT_CLINIC_OR_DEPARTMENT_OTHER)
Admission: EM | Admit: 2018-04-01 | Discharge: 2018-04-01 | Disposition: A | Discharge status: Home / Self Care | Payer: BLUE CROSS/BLUE SHIELD | Attending: Emergency Medicine | Admitting: Emergency Medicine

## 2018-04-01 ENCOUNTER — Other Ambulatory Visit: Payer: Self-pay

## 2018-04-01 DIAGNOSIS — R69 Illness, unspecified: Secondary | ICD-10-CM

## 2018-04-01 DIAGNOSIS — F1120 Opioid dependence, uncomplicated: Secondary | ICD-10-CM | POA: Insufficient documentation

## 2018-04-01 DIAGNOSIS — F1420 Cocaine dependence, uncomplicated: Secondary | ICD-10-CM | POA: Insufficient documentation

## 2018-04-01 DIAGNOSIS — J111 Influenza due to unidentified influenza virus with other respiratory manifestations: Secondary | ICD-10-CM | POA: Insufficient documentation

## 2018-04-01 DIAGNOSIS — F329 Major depressive disorder, single episode, unspecified: Secondary | ICD-10-CM | POA: Insufficient documentation

## 2018-04-01 DIAGNOSIS — Z87891 Personal history of nicotine dependence: Secondary | ICD-10-CM | POA: Insufficient documentation

## 2018-04-01 DIAGNOSIS — Z79899 Other long term (current) drug therapy: Secondary | ICD-10-CM | POA: Insufficient documentation

## 2018-04-01 MED ORDER — BENZONATATE 100 MG PO CAPS: 100.0000 mg | ORAL_CAPSULE | Freq: Three times a day (TID) | ORAL | 0 refills | Status: AC

## 2018-04-01 NOTE — ED Provider Notes (Signed)
[facility] EMERGENCY DEPARTMENT Provider Note   CSN: [id number] Arrival date & time: 04/01/18  1632    History   Chief Complaint Chief Complaint  Patient presents with  . Generalized Body Aches    HPI William Bass is a 43 y.o. male.     HPI   Pt is a 43 y/o male with a h/o depression, heroin abuse, who presents to the ED today for evaluation of fevers, chills, body aches, sore throat, nasal congestion, wheezing, and chest pain with cough only that began 4 days ago. He also developed diarrhea today (x1 episode). No vomiting or abd pain. He has tried mucinex, nyquil, and other OTC medications without relief. Reports sick contacts with similar sxs. He did not receive his flu shot.   Past Medical History:  Diagnosis Date  . Depression   . Heroin abuse   . Knee pain, left     Patient Active Problem List   Diagnosis Date Noted  . Opioid use disorder, moderate, dependence (HCC) 10/05/2016  . Cocaine use disorder, moderate, dependence ([facility]) 10/05/2016  . Opioid abuse with opioid-induced mood disorder ([facility]) 10/05/2016  . Involuntary commitment   . KNEE PAIN, LEFT 08/10/2007  . OVERWEIGHT 02/23/2007    No past surgical history on file.      Home Medications    Prior to Admission medications   Medication Sig Start Date End Date Taking? Authorizing Provider  benzonatate (TESSALON) 100 MG capsule Take 1 capsule (100 mg total) by mouth every 8 (eight) hours for 5 days. 04/01/18 04/06/18  Marialuisa Bass S, PA-C  cloNIDine (CATAPRES) 0.2 MG tablet Take 1 tablet (0.2 mg total) by mouth 3 (three) times daily. Patient not taking: Reported on 10/05/2016 09/29/15 10/02/15  Raeford Razor, MD  gabapentin (NEURONTIN) 100 MG capsule Take 2 capsules (200 mg total) by mouth 2 (two) times daily. 10/25/16   Charm Rings, NP  hydrOXYzine (ATARAX/VISTARIL) 25 MG tablet Take 1 tablet (25 mg total) by mouth every 6 (six) hours. Patient not taking: Reported on 10/05/2016 08/03/15   Janne Napoleon, NP  ondansetron (ZOFRAN ODT) 8 MG disintegrating tablet Take 1 tablet (8 mg total) by mouth every 8 (eight) hours as needed for nausea or vomiting. Patient not taking: Reported on 10/05/2016 10/01/16   Margarita Grizzle, MD    Family History No family history on file.  Social History Social History   Tobacco Use  . Smoking status: Former Games developer  . Smokeless tobacco: Never Used  Substance Use Topics  . Alcohol use: No  . Drug use: Yes    Types: Cocaine     Allergies   Ibuprofen   Review of Systems Review of Systems  Constitutional: Positive for chills, fatigue and fever.  HENT: Positive for congestion, rhinorrhea and sore throat. Negative for ear pain.   Eyes: Negative for visual disturbance.  Respiratory: Positive for cough and wheezing. Negative for shortness of breath.   Cardiovascular: Positive for chest pain (with cough only).  Gastrointestinal: Positive for diarrhea. Negative for abdominal pain, blood in stool, constipation, nausea and vomiting.  Genitourinary: Negative for hematuria.  Musculoskeletal: Positive for myalgias.  Skin: Negative for rash.  Neurological: Negative for headaches.  All other systems reviewed and are negative.    Physical Exam Updated Vital Signs BP 125/85 (BP Location: Left Arm)   Pulse 95   Temp 98.7 F (37.1 C) (Oral)   Resp 20   Ht 5\' 7"  (1.702 m)   Wt 106.6 kg  SpO2 97%   BMI 36.81 kg/m   Physical Exam Vitals signs and nursing note reviewed.  Constitutional:      General: He is not in acute distress.    Appearance: He is well-developed. He is not ill-appearing or toxic-appearing.  HENT:     Head: Normocephalic and atraumatic.     Right Ear: Tympanic membrane normal.     Left Ear: Tympanic membrane normal.     Nose: Nose normal.     Mouth/Throat:     Mouth: Mucous membranes are moist.     Pharynx: No oropharyngeal exudate or posterior oropharyngeal erythema.  Eyes:     Conjunctiva/sclera: Conjunctivae normal.  Neck:      Musculoskeletal: Neck supple.  Cardiovascular:     Rate and Rhythm: Normal rate and regular rhythm.     Pulses: Normal pulses.     Heart sounds: Normal heart sounds. No murmur.  Pulmonary:     Effort: Pulmonary effort is normal. No respiratory distress.     Breath sounds: Normal breath sounds. No stridor. No wheezing, rhonchi or rales.     Comments: Cough on exam Abdominal:     Palpations: Abdomen is soft.     Tenderness: There is no abdominal tenderness.  Skin:    General: Skin is warm and dry.  Neurological:     Mental Status: He is alert.      ED Treatments / Results  Labs (all labs ordered are listed, but only abnormal results are displayed) Labs Reviewed - No data to display  EKG None  Radiology No results found.  Procedures Procedures (including critical care time)  Medications Ordered in ED Medications - No data to display   Initial Impression / Assessment and Plan / ED Course  I have reviewed the triage vital signs and the nursing notes.  Pertinent labs & imaging results that were available during my care of the patient were reviewed by me and considered in my medical decision making (see chart for details).         Final Clinical Impressions(s) / ED Diagnoses   Final diagnoses:  Influenza-like illness   Patient with symptoms consistent with influenza.  Vitals are stable, low-grade fever.  No signs of dehydration, tolerating PO's.  Lungs are clear. Due to patient's presentation and physical exam a chest x-ray was not ordered bc likely diagnosis of flu.  Discussed the cost versus benefit of Tamiflu treatment with the patient.  The patient understands that symptoms are greater than the recommended 24-48 hour window of treatment.  Patient will be discharged with instructions to orally hydrate, rest, and use over-the-counter medications such as  Tylenol for fever.  Patient will also be given a cough suppressant.    ED Discharge Orders         Ordered     benzonatate (TESSALON) 100 MG capsule  Every 8 hours     04/01/18 [address], PA-C 04/01/18 1654    Maia Plan, MD 04/02/18 [phone]

## 2018-04-01 NOTE — ED Triage Notes (Signed)
Reports generalized body aches, fever, chills, sore throat x 3 days.

## 2018-04-01 NOTE — Discharge Instructions (Signed)
You should follow up with your primary healthcare provider within the next 3-5 days for reevaluation. ° °You will need to return to the emergency department immediately if you experience any of the following symptoms: ° °Difficulty breathing or shortness or breath °Pain or pressure in the chest or abdomen °Sudden dizziness °Confusion °Severe or persistent vomiting °Flu-like symptoms that improve but then return with fever or worse cough ° °You should stay home for at least 24 hours after your fever is gone except to get medical care or other necessities. Your fever should be gone without the need to use a fever-reducing medicine, such as Tylenol or Motrin. Until then, you should stay home from work, school, travel, shopping, social events, and public gatherings. ° °Stay away from others as much as possible to keep from infecting them. If you must leave home, for example to get medical care, wear a facemask if you have one, or cover coughs and sneezes with a tissue. Wash your hands often to keep from spreading flu to others ° °

## 2020-05-19 ENCOUNTER — Other Ambulatory Visit (HOSPITAL_COMMUNITY): Payer: Self-pay

## 2020-06-19 ENCOUNTER — Other Ambulatory Visit (HOSPITAL_COMMUNITY): Payer: Self-pay

## 2021-11-30 ENCOUNTER — Emergency Department (HOSPITAL_BASED_OUTPATIENT_CLINIC_OR_DEPARTMENT_OTHER)
Admission: EM | Admit: 2021-11-30 | Discharge: 2021-11-30 | Disposition: A | Discharge status: Home / Self Care | Payer: 59 | Attending: Emergency Medicine | Admitting: Emergency Medicine

## 2021-11-30 ENCOUNTER — Emergency Department (HOSPITAL_BASED_OUTPATIENT_CLINIC_OR_DEPARTMENT_OTHER): Payer: 59

## 2021-11-30 ENCOUNTER — Encounter (HOSPITAL_BASED_OUTPATIENT_CLINIC_OR_DEPARTMENT_OTHER): Payer: Self-pay | Admitting: Emergency Medicine

## 2021-11-30 DIAGNOSIS — S0031XA Abrasion of nose, initial encounter: Secondary | ICD-10-CM

## 2021-11-30 DIAGNOSIS — W228XXA Striking against or struck by other objects, initial encounter: Secondary | ICD-10-CM | POA: Insufficient documentation

## 2021-11-30 DIAGNOSIS — S022XXA Fracture of nasal bones, initial encounter for closed fracture: Secondary | ICD-10-CM | POA: Insufficient documentation

## 2021-11-30 DIAGNOSIS — S0083XA Contusion of other part of head, initial encounter: Secondary | ICD-10-CM | POA: Insufficient documentation

## 2021-11-30 DIAGNOSIS — F0781 Postconcussional syndrome: Secondary | ICD-10-CM | POA: Diagnosis not present

## 2021-11-30 DIAGNOSIS — S0993XA Unspecified injury of face, initial encounter: Secondary | ICD-10-CM | POA: Diagnosis present

## 2021-11-30 DIAGNOSIS — Z87891 Personal history of nicotine dependence: Secondary | ICD-10-CM | POA: Insufficient documentation
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
## 2022-09-20 ENCOUNTER — Other Ambulatory Visit: Payer: Self-pay

## 2022-09-20 ENCOUNTER — Emergency Department (HOSPITAL_BASED_OUTPATIENT_CLINIC_OR_DEPARTMENT_OTHER)
Admission: EM | Admit: 2022-09-20 | Discharge: 2022-09-21 | Disposition: A | Discharge status: Home / Self Care | Payer: 59 | Attending: Emergency Medicine | Admitting: Emergency Medicine

## 2022-09-20 ENCOUNTER — Encounter (HOSPITAL_BASED_OUTPATIENT_CLINIC_OR_DEPARTMENT_OTHER): Payer: Self-pay

## 2022-09-20 DIAGNOSIS — R531 Weakness: Secondary | ICD-10-CM | POA: Diagnosis present

## 2022-09-20 DIAGNOSIS — E860 Dehydration: Secondary | ICD-10-CM | POA: Insufficient documentation

## 2022-09-20 LAB — URINALYSIS, ROUTINE W REFLEX MICROSCOPIC
Bilirubin Urine: NEGATIVE
Glucose, UA: NEGATIVE mg/dL
Hgb urine dipstick: NEGATIVE
Ketones, ur: NEGATIVE mg/dL
Leukocytes,Ua: NEGATIVE
Nitrite: NEGATIVE
Protein, ur: NEGATIVE mg/dL
Specific Gravity, Urine: >=1.03 (ref 1.005–1.030)
pH: 7 (ref 5.0–8.0)

## 2022-09-20 LAB — COMPREHENSIVE METABOLIC PANEL
ALT: 32 U/L (ref 0–44)
AST: 21 U/L (ref 15–41)
Albumin: 4.1 g/dL (ref 3.5–5.0)
Alkaline Phosphatase: 78 U/L (ref 38–126)
Anion gap: 10 (ref 5–15)
BUN: 13 mg/dL (ref 6–20)
CO2: 22 mmol/L (ref 22–32)
Calcium: 9.1 mg/dL (ref 8.9–10.3)
Chloride: 106 mmol/L (ref 98–111)
Creatinine, Ser: 0.86 mg/dL (ref 0.61–1.24)
GFR, Estimated: >60 mL/min (ref >60)
Glucose, Bld: 111 mg/dL — ABNORMAL HIGH (ref 70–99)
Potassium: 3.7 mmol/L (ref 3.5–5.1)
Sodium: 138 mmol/L (ref 135–145)
Total Bilirubin: 0.3 mg/dL (ref 0.3–1.2)
Total Protein: 7.4 g/dL (ref 6.5–8.1)

## 2022-09-20 LAB — CBC WITH DIFFERENTIAL/PLATELET
Abs Immature Granulocytes: 0.02 10*3/uL (ref 0.00–0.07)
Basophils Absolute: 0 10*3/uL (ref 0.0–0.1)
Basophils Relative: 1 %
Eosinophils Absolute: 0.1 10*3/uL (ref 0.0–0.5)
Eosinophils Relative: 2 %
HCT: 41.3 % (ref 39.0–52.0)
Hemoglobin: 13.7 g/dL (ref 13.0–17.0)
Immature Granulocytes: 0 %
Lymphocytes Relative: 19 %
Lymphs Abs: 1.2 10*3/uL (ref 0.7–4.0)
MCH: 29.3 pg (ref 26.0–34.0)
MCHC: 33.2 g/dL (ref 30.0–36.0)
MCV: 88.4 fL (ref 80.0–100.0)
Monocytes Absolute: 0.4 10*3/uL (ref 0.1–1.0)
Monocytes Relative: 6 %
Neutro Abs: 4.5 10*3/uL (ref 1.7–7.7)
Neutrophils Relative %: 72 %
Platelets: 202 10*3/uL (ref 150–400)
RBC: 4.67 MIL/uL (ref 4.22–5.81)
RDW: 12.8 % (ref 11.5–15.5)
WBC: 6.3 10*3/uL (ref 4.0–10.5)
nRBC: 0 % (ref 0.0–0.2)

## 2022-09-20 LAB — CBG MONITORING, ED: Glucose-Capillary: 106 mg/dL — ABNORMAL HIGH (ref 70–99)

## 2022-09-20 LAB — TROPONIN I (HIGH SENSITIVITY): Troponin I (High Sensitivity): 3 ng/L (ref <18)

## 2022-09-20 NOTE — ED Triage Notes (Addendum)
Pt states earlier today while working he broke out in a sweat, weakness and [facility] Chest feels heavy, he feels weak at present BS 106 in triage

## 2022-09-20 NOTE — ED Provider Notes (Signed)
[name] Provider Note   CSN: [id number] Arrival date & time: 09/20/22  2118     History {Add pertinent medical, surgical, social history, OB history to HPI:1} Chief Complaint  Patient presents with   Weakness    William Bass is a 47 y.o. male with history of depression, presents with concern for an episode of weakness and near syncope that happened approximately 4 PM today.  He was working outside and started to feel lightheaded and weak.  He had to sit down, and this feeling lasted about 15 minutes and then resolved.  He has not had any more episodes, but still feels slightly weak.  He denies any chest pain, palpitations, shortness of breath, changes in vision, loss of consciousness.  Denies any recent illnesses or changes in medication.  He states he has been eating and drinking normally.  Denies any previous cardiac issues  Weakness      Home Medications Prior to Admission medications   Medication Sig Start Date End Date Taking? Authorizing Provider  gabapentin (NEURONTIN) 100 MG capsule Take 2 capsules (200 mg total) by mouth 2 (two) times daily. 10/25/16   Charm Rings, NP      Allergies    Ibuprofen and Nsaids    Review of Systems   Review of Systems  Neurological:  Positive for weakness.    Physical Exam Updated Vital Signs BP 115/79 (BP Location: Left Arm)   Pulse 66   Temp 97.7 F (36.5 C)   Resp 20   Ht 5\' 7"  (1.702 m)   Wt 108.9 kg   SpO2 100%   BMI 37.59 kg/m  Physical Exam Vitals and nursing note reviewed.  Constitutional:      General: He is not in acute distress.    Appearance: He is well-developed.  HENT:     Head: Normocephalic and atraumatic.     Right Ear: Tympanic membrane normal.     Left Ear: Tympanic membrane normal.     Mouth/Throat:     Mouth: Mucous membranes are dry.  Eyes:     Extraocular Movements: Extraocular movements intact.     Conjunctiva/sclera: Conjunctivae normal.      Pupils: Pupils are equal, round, and reactive to light.  Cardiovascular:     Rate and Rhythm: Normal rate and regular rhythm.     Heart sounds: No murmur heard. Pulmonary:     Effort: Pulmonary effort is normal. No respiratory distress.     Breath sounds: Normal breath sounds.  Abdominal:     Palpations: Abdomen is soft.     Tenderness: There is no abdominal tenderness.  Musculoskeletal:        General: No swelling.     Cervical back: Normal range of motion and neck supple.  Skin:    General: Skin is warm and dry.     Capillary Refill: Capillary refill takes less than 2 seconds.  Neurological:     Mental Status: He is alert.     Comments: Cranial nerves III through XII intact 5/5 strength in the upper and lower extremities bilaterally Sensation intact in the upper and lower extremities bilaterally Patient able to ambulate without difficulty  Psychiatric:        Mood and Affect: Mood normal.     ED Results / Procedures / Treatments   Labs (all labs ordered are listed, but only abnormal results are displayed) Labs Reviewed  COMPREHENSIVE METABOLIC PANEL - Abnormal; Notable for the following components:  Result Value   Glucose, Bld 111 (*)    All other components within normal limits  CBG MONITORING, ED - Abnormal; Notable for the following components:   Glucose-Capillary 106 (*)    All other components within normal limits  CBC WITH DIFFERENTIAL/PLATELET  URINALYSIS, ROUTINE W REFLEX MICROSCOPIC  TROPONIN I (HIGH SENSITIVITY)  TROPONIN I (HIGH SENSITIVITY)    EKG EKG Interpretation Date/Time:  Tuesday September 20 2022 21:34:27 EDT Ventricular Rate:  73 PR Interval:  201 QRS Duration:  95 QT Interval:  381 QTC Calculation: 420 R Axis:   97  Text Interpretation: Sinus rhythm Borderline right axis deviation ST elev, probable normal early repol pattern No old tracing to compare Confirmed by Alona Bene [phone]) on 09/20/2022 9:37:14 PM  Radiology No results  found.  Procedures Procedures  {Document cardiac monitor, telemetry assessment procedure when appropriate:1}  Medications Ordered in ED Medications - No data to display  ED Course/ Medical Decision Making/ A&P   {   Click here for ABCD2, HEART and other calculatorsREFRESH Note before signing :1}                              Medical Decision Making Amount and/or Complexity of Data Reviewed Labs: ordered.   47 y.o. male presents to the ED for concern of episode of weakness and lightheadedness earlier today  Differential diagnosis includes but is not limited to dehydration, ACS, arrhythmia, electrolyte abnormality, hypoglycemia  ED Course:  Patient presented to the ER for concern of a 15-minute episode of weakness and lightheadedness earlier today at approximately 4 PM.  Only notes feeling slightly weak now, but no other episodes of presyncope.  On physical exam, he has no neurological deficits.  He has regular rate and rhythm on cardiac auscultation, no murmurs appreciated.  EKG with normal sinus rhythm, first troponin of 3 and second troponin of ***, HEART score of 1 due to age, low suspicion for ACS or arrhythmia. No electrolyte abnormalities on CMP.  CBC without any signs of anemia, no leukocytosis.  Urinalysis without any signs of UTI, but does show dehydration. I reviewed this case with Dr. Jacqulyn Bath, the patient is appropriate for discharge at this time.  He may continue with oral rehydration at home given he is likely dehydrated and this may have contributed to his dizziness earlier.  Impression: Feeling of weakness Dehydration  Disposition:  The patient was discharged home with instructions to *** {plan:60810} Return precautions given.  Lab Tests: I Ordered, and personally interpreted labs.  The pertinent results include:    Cardiac Monitoring: / EKG: The patient was maintained on a cardiac monitor.  I personally viewed and interpreted the cardiac monitored which showed an  underlying rhythm of: Normal sinus rhythm  Social Determinants of Health:  {NGEX:52841}       {Document critical care time when appropriate:1} {Document review of labs and clinical decision tools ie heart score, Chads2Vasc2 etc:1}  {Document your independent review of radiology images, and any outside records:1} {Document your discussion with family members, caretakers, and with consultants:1} {Document social determinants of health affecting pt's care:1} {Document your decision making why or why not admission, treatments were needed:1} Final Clinical Impression(s) / ED Diagnoses Final diagnoses:  None    Rx / DC Orders ED Discharge Orders     None

## 2022-09-21 LAB — TROPONIN I (HIGH SENSITIVITY): Troponin I (High Sensitivity): 4 ng/L (ref <18)

## 2022-09-21 NOTE — Discharge Instructions (Addendum)
Please continue to eat well-hydrated at home, it does appear you are dehydrated.  Overall, your labs here today and EKG look reassuring.  Please follow-up with your PCP within the next week for follow-up.  Return to the ER if you have any chest pain, shortness of breath, loss of consciousness, any other new or concerning symptoms.

## 2023-03-15 DIAGNOSIS — R059 Cough, unspecified: Secondary | ICD-10-CM | POA: Diagnosis not present

## 2023-04-04 ENCOUNTER — Ambulatory Visit
Admission: RE | Admit: 2023-04-04 | Discharge: 2023-04-04 | Disposition: A | Discharge status: Home / Self Care | Source: Ambulatory Visit | Attending: Family Medicine | Admitting: Family Medicine

## 2023-04-04 VITALS — BP 120/77 | HR 105 | Temp 99.7°F | Resp 17

## 2023-04-04 DIAGNOSIS — R059 Cough, unspecified: Secondary | ICD-10-CM | POA: Diagnosis not present

## 2023-04-04 DIAGNOSIS — R6889 Other general symptoms and signs: Secondary | ICD-10-CM

## 2023-04-04 DIAGNOSIS — R509 Fever, unspecified: Secondary | ICD-10-CM

## 2023-04-04 LAB — POCT INFLUENZA A/B
Influenza A, POC: NEGATIVE
Influenza B, POC: NEGATIVE

## 2023-04-04 LAB — POC SARS CORONAVIRUS 2 AG -  ED: SARS Coronavirus 2 Ag: NEGATIVE

## 2023-04-04 MED ORDER — ACETAMINOPHEN 500 MG PO TABS
1000.0000 mg | ORAL_TABLET | ORAL | Status: AC
  Administered 2023-04-04: 1000 mg via ORAL

## 2023-04-04 MED ORDER — PROMETHAZINE-DM 6.25-15 MG/5ML PO SYRP: 5.0000 mL | ORAL_SOLUTION | Freq: Two times a day (BID) | ORAL | 0 refills | Status: AC | PRN

## 2023-04-04 MED ORDER — PREDNISONE 20 MG PO TABS: ORAL_TABLET | ORAL | 0 refills | Status: AC

## 2023-04-04 NOTE — ED Provider Notes (Signed)
 [name]    CSN: [id number] Arrival date & time: 04/04/23  1817      History   Chief Complaint Chief Complaint  Patient presents with   Cough   Fever   Generalized Body Aches    HPI William Bass is a 48 y.o. male.   HPI 48 year old male presents with cough, fever and generalized bodyaches for 3 days.  PMH significant for obesity, heroin abuse, and opioid use disorder, moderate, dependence.  Past Medical History:  Diagnosis Date   Depression    Heroin abuse (HCC)    Knee pain, left     Patient Active Problem List   Diagnosis Date Noted   Opioid use disorder, moderate, dependence (HCC) 10/05/2016   Cocaine use disorder, moderate, dependence (HCC) 10/05/2016   Opioid abuse with opioid-induced mood disorder (HCC) 10/05/2016   Involuntary commitment    KNEE PAIN, LEFT 08/10/2007   Overweight 02/23/2007    History reviewed. No pertinent surgical history.     Home Medications    Prior to Admission medications   Medication Sig Start Date End Date Taking? Authorizing Provider  predniSONE (DELTASONE) 20 MG tablet Take 3 tabs PO daily x 5 days. 04/04/23  Yes Trevor Iha, FNP  promethazine-dextromethorphan (PROMETHAZINE-DM) 6.25-15 MG/5ML syrup Take 5 mLs by mouth 2 (two) times daily as needed for cough. 04/04/23  Yes Trevor Iha, FNP  gabapentin (NEURONTIN) 100 MG capsule Take 2 capsules (200 mg total) by mouth 2 (two) times daily. 10/25/16   Charm Rings, NP    Family History History reviewed. No pertinent family history.  Social History Social History   Tobacco Use   Smoking status: Former   Smokeless tobacco: Never  Advertising account planner   Vaping status: Every Day  Substance Use Topics   Alcohol use: No   Drug use: Not Currently     Allergies   Ibuprofen and Nsaids   Review of Systems Review of Systems  Constitutional:  Positive for fever.  Respiratory:  Positive for cough.   Musculoskeletal:  Positive for arthralgias and myalgias.  All  other systems reviewed and are negative.    Physical Exam Triage Vital Signs ED Triage Vitals [04/04/23 1828]  Encounter Vitals Group     BP 120/77     Systolic BP Percentile      Diastolic BP Percentile      Pulse Rate (!) 105     Resp 17     Temp 99.7 F (37.6 C)     Temp Source Oral     SpO2 97 %     Weight      Height      Head Circumference      Peak Flow      Pain Score 0     Pain Loc      Pain Education      Exclude from Growth Chart    No data found.  Updated Vital Signs BP 120/77 (BP Location: Left Arm)   Pulse (!) 105   Temp 99.7 F (37.6 C) (Oral)   Resp 17   SpO2 97%    Physical Exam Vitals and nursing note reviewed.  Constitutional:      Appearance: Normal appearance. He is obese. He is ill-appearing.  HENT:     Head: Normocephalic and atraumatic.     Right Ear: Tympanic membrane, ear canal and external ear normal.     Left Ear: Tympanic membrane, ear canal and external ear normal.  Nose: Nose normal.     Mouth/Throat:     Mouth: Mucous membranes are moist.     Pharynx: Oropharynx is clear.  Eyes:     Extraocular Movements: Extraocular movements intact.     Conjunctiva/sclera: Conjunctivae normal.     Pupils: Pupils are equal, round, and reactive to light.  Cardiovascular:     Rate and Rhythm: Normal rate and regular rhythm.     Pulses: Normal pulses.     Heart sounds: Normal heart sounds.  Pulmonary:     Effort: Pulmonary effort is normal.     Breath sounds: Normal breath sounds. No wheezing, rhonchi or rales.     Comments: Infrequent nonproductive cough on exam Musculoskeletal:        General: Normal range of motion.     Cervical back: Normal range of motion and neck supple.  Skin:    General: Skin is warm and dry.  Neurological:     General: No focal deficit present.     Mental Status: He is alert and oriented to person, place, and time. Mental status is at baseline.  Psychiatric:        Mood and Affect: Mood normal.         Behavior: Behavior normal.      UC Treatments / Results  Labs (all labs ordered are listed, but only abnormal results are displayed) Labs Reviewed  POCT INFLUENZA A/B  POC SARS CORONAVIRUS 2 AG -  ED    EKG   Radiology No results found.  Procedures Procedures (including critical care time)  Medications Ordered in UC Medications - No data to display  Initial Impression / Assessment and Plan / UC Course  I have reviewed the triage vital signs and the nursing notes.  Pertinent labs & imaging results that were available during my care of the patient were reviewed by me and considered in my medical decision making (see chart for details).     MDM: 1.  Influenza-like symptoms-COVID-19, Influenza negative; 2.  Cough, unspecified type-Rx'd Promethazine DM 6.25-15 Mg/5 mL syrup: Take 5 mL twice daily, as needed for cough 3.  Fever, unspecified-Advised patient may take OTC Tylenol 1 g every 6 hours for fever (oral temperature greater than 100.3). Advised patient to take medications as directed with food to completion.  Advised may take Promethazine DM for cough at night prior to sleep due to sedative effects.  Advised patient may take OTC Tylenol 1 g every 6 hours for fever (oral temperature greater than 100.3).  Encouraged to increase daily water intake to 64 ounces per day while taking these medications.  Advised if symptoms worsen and/or unresolved please follow-up with your PCP or here for further evaluation.  Patient discharged home, hemodynamically stable. Final Clinical Impressions(s) / UC Diagnoses   Final diagnoses:  Influenza-like symptoms  Cough, unspecified type  Fever, unspecified     Discharge Instructions      Advised patient to take medications as directed with food to completion.  Advised may take Promethazine DM for cough at night prior to sleep due to sedative effects.  Advised patient may take OTC Tylenol 1 g every 6 hours for fever (oral temperature greater  than 100.3).  Encouraged to increase daily water intake to 64 ounces per day while taking these medications.  Advised if symptoms worsen and/or unresolved please follow-up with your PCP or here for further evaluation.     ED Prescriptions     Medication Sig Dispense Auth. Provider   predniSONE (  DELTASONE) 20 MG tablet Take 3 tabs PO daily x 5 days. 15 tablet Trevor Iha, FNP   promethazine-dextromethorphan (PROMETHAZINE-DM) 6.25-15 MG/5ML syrup Take 5 mLs by mouth 2 (two) times daily as needed for cough. 118 mL Trevor Iha, FNP      I have reviewed the PDMP during this encounter.   Trevor Iha, FNP 04/04/23 1858

## 2023-04-04 NOTE — ED Triage Notes (Signed)
 Pt c/o cough, bodyaches and fever since Sunday. Taking dayquil prn.

## 2023-04-04 NOTE — Discharge Instructions (Addendum)
 Advised patient to take medications as directed with food to completion.  Advised may take Promethazine DM for cough at night prior to sleep due to sedative effects.  Advised patient may take OTC Tylenol 1 g every 6 hours for fever (oral temperature greater than 100.3).  Encouraged to increase daily water intake to 64 ounces per day while taking these medications.  Advised if symptoms worsen and/or unresolved please follow-up with your PCP or here for further evaluation.

## 2023-05-01 DIAGNOSIS — R197 Diarrhea, unspecified: Secondary | ICD-10-CM | POA: Diagnosis not present

## 2023-05-01 DIAGNOSIS — R6883 Chills (without fever): Secondary | ICD-10-CM | POA: Diagnosis not present

## 2023-05-13 ENCOUNTER — Emergency Department (HOSPITAL_BASED_OUTPATIENT_CLINIC_OR_DEPARTMENT_OTHER)

## 2023-05-13 ENCOUNTER — Other Ambulatory Visit: Payer: Self-pay

## 2023-05-13 ENCOUNTER — Emergency Department (HOSPITAL_BASED_OUTPATIENT_CLINIC_OR_DEPARTMENT_OTHER)
Admission: EM | Admit: 2023-05-13 | Discharge: 2023-05-14 | Disposition: A | Discharge status: Home / Self Care | Attending: Emergency Medicine | Admitting: Emergency Medicine

## 2023-05-13 ENCOUNTER — Encounter (HOSPITAL_BASED_OUTPATIENT_CLINIC_OR_DEPARTMENT_OTHER): Payer: Self-pay | Admitting: Emergency Medicine

## 2023-05-13 DIAGNOSIS — R079 Chest pain, unspecified: Secondary | ICD-10-CM | POA: Diagnosis not present

## 2023-05-13 DIAGNOSIS — S20211A Contusion of right front wall of thorax, initial encounter: Secondary | ICD-10-CM | POA: Diagnosis not present

## 2023-05-13 DIAGNOSIS — Y92410 Unspecified street and highway as the place of occurrence of the external cause: Secondary | ICD-10-CM | POA: Insufficient documentation

## 2023-05-13 DIAGNOSIS — R0781 Pleurodynia: Secondary | ICD-10-CM | POA: Diagnosis present

## 2023-05-13 DIAGNOSIS — S20219A Contusion of unspecified front wall of thorax, initial encounter: Secondary | ICD-10-CM | POA: Insufficient documentation

## 2023-05-13 LAB — CBC WITH DIFFERENTIAL/PLATELET
Abs Immature Granulocytes: 0.04 10*3/uL (ref 0.00–0.07)
Basophils Absolute: 0 10*3/uL (ref 0.0–0.1)
Basophils Relative: 1 %
Eosinophils Absolute: 0.1 10*3/uL (ref 0.0–0.5)
Eosinophils Relative: 2 %
HCT: 41.5 % (ref 39.0–52.0)
Hemoglobin: 14.1 g/dL (ref 13.0–17.0)
Immature Granulocytes: 1 %
Lymphocytes Relative: 16 %
Lymphs Abs: 1.4 10*3/uL (ref 0.7–4.0)
MCH: 30.3 pg (ref 26.0–34.0)
MCHC: 34 g/dL (ref 30.0–36.0)
MCV: 89.2 fL (ref 80.0–100.0)
Monocytes Absolute: 0.6 10*3/uL (ref 0.1–1.0)
Monocytes Relative: 7 %
Neutro Abs: 6.6 10*3/uL (ref 1.7–7.7)
Neutrophils Relative %: 73 %
Platelets: 232 10*3/uL (ref 150–400)
RBC: 4.65 MIL/uL (ref 4.22–5.81)
RDW: 12.6 % (ref 11.5–15.5)
WBC: 8.8 10*3/uL (ref 4.0–10.5)
nRBC: 0 % (ref 0.0–0.2)

## 2023-05-13 LAB — COMPREHENSIVE METABOLIC PANEL WITH GFR
ALT: 57 U/L — ABNORMAL HIGH (ref 0–44)
AST: 29 U/L (ref 15–41)
Albumin: 3.8 g/dL (ref 3.5–5.0)
Alkaline Phosphatase: 65 U/L (ref 38–126)
Anion gap: 11 (ref 5–15)
BUN: 14 mg/dL (ref 6–20)
CO2: 25 mmol/L (ref 22–32)
Calcium: 9 mg/dL (ref 8.9–10.3)
Chloride: 103 mmol/L (ref 98–111)
Creatinine, Ser: 0.91 mg/dL (ref 0.61–1.24)
GFR, Estimated: >60 mL/min (ref >60)
Glucose, Bld: 189 mg/dL — ABNORMAL HIGH (ref 70–99)
Potassium: 3.3 mmol/L — ABNORMAL LOW (ref 3.5–5.1)
Sodium: 139 mmol/L (ref 135–145)
Total Bilirubin: 0.4 mg/dL (ref 0.0–1.2)
Total Protein: 6.7 g/dL (ref 6.5–8.1)

## 2023-05-13 LAB — LIPASE, BLOOD: Lipase: 32 U/L (ref 11–51)

## 2023-05-13 MED ORDER — FENTANYL CITRATE PF 50 MCG/ML IJ SOSY
50.0000 ug | PREFILLED_SYRINGE | Freq: Once | INTRAMUSCULAR | Status: AC
  Administered 2023-05-13: 50 ug via INTRAVENOUS
  Filled 2023-05-13: qty 1

## 2023-05-13 MED ORDER — IOHEXOL 300 MG/ML  SOLN
75.0000 mL | Freq: Once | INTRAMUSCULAR | Status: AC | PRN
  Administered 2023-05-13: 75 mL via INTRAVENOUS
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
## 2023-05-14 MED ORDER — CYCLOBENZAPRINE HCL 10 MG PO TABS: 10.0000 mg | ORAL_TABLET | Freq: Two times a day (BID) | ORAL | 0 refills | Status: AC | PRN

## 2023-05-14 MED ORDER — LIDOCAINE 5 % EX PTCH: 1.0000 | MEDICATED_PATCH | Freq: Every day | CUTANEOUS | 0 refills | Status: AC | PRN

## 2023-05-14 MED ORDER — ACETAMINOPHEN 325 MG PO TABS: 650.0000 mg | ORAL_TABLET | Freq: Four times a day (QID) | ORAL | 0 refills | Status: AC | PRN

## 2023-05-14 NOTE — Discharge Instructions (Signed)
 It was a pleasure caring for you today in the emergency department.  Please use incentive spirometer throughout the day for the next 3 to 5 days.  Please follow with your PCP for recheck  Please return to the emergency department for any worsening or worrisome symptoms.
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
## 2023-05-29 ENCOUNTER — Other Ambulatory Visit (HOSPITAL_BASED_OUTPATIENT_CLINIC_OR_DEPARTMENT_OTHER): Payer: Self-pay

## 2023-05-29 MED ORDER — VENLAFAXINE HCL ER 37.5 MG PO CP24
37.5000 mg | ORAL_CAPSULE | Freq: Every day | ORAL | 0 refills | Status: DC
  Filled 2023-05-29: qty 30, 30d supply, fill #0

## 2023-07-05 ENCOUNTER — Other Ambulatory Visit: Payer: Self-pay

## 2023-07-06 ENCOUNTER — Other Ambulatory Visit (HOSPITAL_BASED_OUTPATIENT_CLINIC_OR_DEPARTMENT_OTHER): Payer: Self-pay

## 2023-07-06 MED ORDER — VENLAFAXINE HCL ER 37.5 MG PO CP24
37.5000 mg | ORAL_CAPSULE | Freq: Every day | ORAL | 1 refills | Status: AC
  Filled 2023-07-06: qty 90, 90d supply, fill #0
  Filled 2023-10-16: qty 90, 90d supply, fill #1

## 2023-08-31 ENCOUNTER — Other Ambulatory Visit (HOSPITAL_BASED_OUTPATIENT_CLINIC_OR_DEPARTMENT_OTHER): Payer: Self-pay

## 2023-08-31 DIAGNOSIS — U071 COVID-19: Secondary | ICD-10-CM | POA: Diagnosis not present

## 2023-08-31 DIAGNOSIS — R6883 Chills (without fever): Secondary | ICD-10-CM | POA: Diagnosis not present

## 2023-08-31 MED ORDER — VITAMIN D3 50 MCG (2000 UT) PO TABS
50.0000 ug | ORAL_TABLET | Freq: Every day | ORAL | 0 refills | Status: AC
  Filled 2023-08-31: qty 100, 100d supply, fill #0

## 2023-08-31 MED ORDER — AZITHROMYCIN 250 MG PO TABS
ORAL_TABLET | ORAL | 0 refills | Status: AC
  Filled 2023-08-31: qty 6, 5d supply, fill #0

## 2023-08-31 MED ORDER — PREDNISONE 5 MG (21) PO TBPK
ORAL_TABLET | ORAL | 0 refills | Status: AC
  Filled 2023-08-31: qty 21, 6d supply, fill #0

## 2023-08-31 MED ORDER — ZINC GLUCONATE 50 MG PO TABS
50.0000 mg | ORAL_TABLET | Freq: Every day | ORAL | 0 refills | Status: AC
  Filled 2023-08-31 (×2): qty 100, 100d supply, fill #0

## 2023-08-31 MED ORDER — VITAMIN C 500 MG PO TABS
2000.0000 mg | ORAL_TABLET | Freq: Every day | ORAL | 0 refills | Status: AC
  Filled 2023-08-31: qty 100, 25d supply, fill #0

## 2023-09-01 ENCOUNTER — Other Ambulatory Visit (HOSPITAL_BASED_OUTPATIENT_CLINIC_OR_DEPARTMENT_OTHER): Payer: Self-pay

## 2023-09-03 ENCOUNTER — Other Ambulatory Visit: Payer: Self-pay

## 2023-09-03 ENCOUNTER — Emergency Department (HOSPITAL_BASED_OUTPATIENT_CLINIC_OR_DEPARTMENT_OTHER)
Admission: EM | Admit: 2023-09-03 | Discharge: 2023-09-03 | Disposition: A | Discharge status: Home / Self Care | Attending: Emergency Medicine | Admitting: Emergency Medicine

## 2023-09-03 ENCOUNTER — Encounter (HOSPITAL_BASED_OUTPATIENT_CLINIC_OR_DEPARTMENT_OTHER): Payer: Self-pay

## 2023-09-03 DIAGNOSIS — H1132 Conjunctival hemorrhage, left eye: Secondary | ICD-10-CM | POA: Insufficient documentation

## 2023-09-03 DIAGNOSIS — X58XXXA Exposure to other specified factors, initial encounter: Secondary | ICD-10-CM | POA: Diagnosis not present

## 2023-09-03 DIAGNOSIS — S0502XA Injury of conjunctiva and corneal abrasion without foreign body, left eye, initial encounter: Secondary | ICD-10-CM | POA: Diagnosis not present

## 2023-09-03 MED ORDER — FLUORESCEIN SODIUM 1 MG OP STRP
1.0000 | ORAL_STRIP | Freq: Once | OPHTHALMIC | Status: AC
  Administered 2023-09-03: 1 via OPHTHALMIC
  Filled 2023-09-03: qty 1

## 2023-09-03 MED ORDER — TETRACAINE HCL 0.5 % OP SOLN
2.0000 [drp] | Freq: Once | OPHTHALMIC | Status: AC
  Administered 2023-09-03: 2 [drp] via OPHTHALMIC
  Filled 2023-09-03: qty 4

## 2023-09-03 MED ORDER — ERYTHROMYCIN 5 MG/GM OP OINT: TOPICAL_OINTMENT | OPHTHALMIC | 0 refills | Status: AC

## 2023-09-03 NOTE — ED Provider Notes (Signed)
 [place] [facility] Provider Note   CSN: [id number] Arrival date & time: 09/03/23  1633     Patient presents with: Eye Injury   William Bass is a 48 y.o. male.  {Add pertinent medical, surgical, social history, OB history to HPI:32947} HPI     Prior to Admission medications   Medication Sig Start Date End Date Taking? Authorizing Provider  acetaminophen  (TYLENOL ) 325 MG tablet Take 2 tablets (650 mg total) by mouth every 6 (six) hours as needed. 05/14/23   Elnor Jayson LABOR, DO  ascorbic acid  (VITAMIN C ) 500 MG tablet Take 4 tablets (2,000 mg total) by mouth daily. 08/31/23     azithromycin  (ZITHROMAX  Z-PAK) 250 MG tablet Take 2 tablets (500 mg total) by mouth daily for 1 day, THEN 1 tablet (250 mg total) daily for 4 days. 08/31/23 09/05/23    Cholecalciferol  (VITAMIN D3) 50 MCG (2000 UT) TABS Take 1 tablet (50 mcg total) by mouth daily. 08/31/23     cyclobenzaprine  (FLEXERIL ) 10 MG tablet Take 1 tablet (10 mg total) by mouth 2 (two) times daily as needed for muscle spasms. 05/14/23   Elnor Jayson LABOR, DO  gabapentin  (NEURONTIN ) 100 MG capsule Take 2 capsules (200 mg total) by mouth 2 (two) times daily. 10/25/16   Jacquetta Sharlot GRADE, NP  lidocaine  (LIDODERM ) 5 % Place 1 patch onto the skin daily as needed. Remove & Discard patch within 12 hours or as directed by MD 05/14/23   Elnor Jayson LABOR, DO  predniSONE  (DELTASONE ) 20 MG tablet Take 3 tabs PO daily x 5 days. 04/04/23   Teddy Sharper, FNP  predniSONE  (STERAPRED UNI-PAK 21 TAB) 5 MG (21) TBPK tablet Take 6 tablets (30 mg total) by mouth daily for 1 day, THEN 5 tablets (25 mg total) daily for 1 day, THEN 4 tablets (20 mg total) daily for 1 day, THEN 3 tablets (15 mg total) daily for 1 day, THEN 2 tablets (10 mg total) daily for 1 day, THEN 1 tablet (5 mg total) daily for 1 day. 08/31/23 09/06/23    promethazine -dextromethorphan (PROMETHAZINE -DM) 6.25-15 MG/5ML syrup Take 5 mLs by mouth 2 (two) times daily as needed for  cough. 04/04/23   Ragan, Michael, FNP  venlafaxine  XR (EFFEXOR -XR) 37.5 MG 24 hr capsule Take 1 capsule (37.5 mg total) by mouth daily. 07/06/23     zinc  gluconate 50 MG tablet Take 1 tablet (50 mg total) by mouth daily. 08/31/23       Allergies: Ibuprofen and Nsaids    Review of Systems  Updated Vital Signs BP (!) 149/84 (BP Location: Right Arm)   Pulse (!) 101   Temp 98.5 F (36.9 C) (Oral)   Resp 20   SpO2 98%   Physical Exam  (all labs ordered are listed, but only abnormal results are displayed) Labs Reviewed - No data to display  EKG: None  Radiology: No results found.  {Document cardiac monitor, telemetry assessment procedure when appropriate:32947} Procedures   Medications Ordered in the ED  tetracaine  (PONTOCAINE) 0.5 % ophthalmic solution 2 drop (has no administration in time range)  fluorescein  ophthalmic strip 1 strip (has no administration in time range)     Visual Acuity  Right Eye Distance: 20/30 Left Eye Distance: 20/20 Bilateral Distance: 20/20  Right Eye Near:   Left Eye Near:    Bilateral Near:       {Click here for ABCD2, HEART and other calculators REFRESH Note before signing:1}  Medical Decision Making Risk Prescription drug management.   ***  {Document critical care time when appropriate  Document review of labs and clinical decision tools ie CHADS2VASC2, etc  Document your independent review of radiology images and any outside records  Document your discussion with family members, caretakers and with consultants  Document social determinants of health affecting pt's care  Document your decision making why or why not admission, treatments were needed:32947:::1}   Final diagnoses:  None    ED Discharge Orders     None
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
## 2023-09-05 ENCOUNTER — Other Ambulatory Visit (HOSPITAL_BASED_OUTPATIENT_CLINIC_OR_DEPARTMENT_OTHER): Payer: Self-pay

## 2023-09-05 DIAGNOSIS — H1131 Conjunctival hemorrhage, right eye: Secondary | ICD-10-CM | POA: Diagnosis not present

## 2023-09-05 DIAGNOSIS — H16041 Marginal corneal ulcer, right eye: Secondary | ICD-10-CM | POA: Diagnosis not present

## 2023-09-05 MED ORDER — NEOMYCIN-POLYMYXIN-DEXAMETH 0.1 % OP SUSP
1.0000 [drp] | Freq: Four times a day (QID) | OPHTHALMIC | 0 refills | Status: AC
  Filled 2023-09-05: qty 5, 10d supply, fill #0

## 2023-09-08 DIAGNOSIS — H16042 Marginal corneal ulcer, left eye: Secondary | ICD-10-CM | POA: Diagnosis not present

## 2023-09-08 DIAGNOSIS — H1132 Conjunctival hemorrhage, left eye: Secondary | ICD-10-CM | POA: Diagnosis not present

## 2023-09-08 DIAGNOSIS — D23111 Other benign neoplasm of skin of right upper eyelid, including canthus: Secondary | ICD-10-CM | POA: Diagnosis not present

## 2023-09-08 DIAGNOSIS — D23121 Other benign neoplasm of skin of left upper eyelid, including canthus: Secondary | ICD-10-CM | POA: Diagnosis not present

## 2023-10-16 ENCOUNTER — Other Ambulatory Visit (HOSPITAL_BASED_OUTPATIENT_CLINIC_OR_DEPARTMENT_OTHER): Payer: Self-pay

## 2023-10-23 DIAGNOSIS — D23121 Other benign neoplasm of skin of left upper eyelid, including canthus: Secondary | ICD-10-CM | POA: Diagnosis not present

## 2023-10-23 DIAGNOSIS — D23112 Other benign neoplasm of skin of right lower eyelid, including canthus: Secondary | ICD-10-CM | POA: Diagnosis not present

## 2023-10-23 DIAGNOSIS — D23111 Other benign neoplasm of skin of right upper eyelid, including canthus: Secondary | ICD-10-CM | POA: Diagnosis not present

## 2023-11-01 ENCOUNTER — Encounter: Payer: Self-pay | Admitting: Podiatry

## 2023-11-01 ENCOUNTER — Ambulatory Visit (INDEPENDENT_AMBULATORY_CARE_PROVIDER_SITE_OTHER): Admitting: Podiatry

## 2023-11-01 VITALS — Ht 67.5 in | Wt 240.0 lb

## 2023-11-01 DIAGNOSIS — M79671 Pain in right foot: Secondary | ICD-10-CM | POA: Diagnosis not present

## 2023-11-01 DIAGNOSIS — M722 Plantar fascial fibromatosis: Secondary | ICD-10-CM

## 2023-11-01 DIAGNOSIS — M79672 Pain in left foot: Secondary | ICD-10-CM

## 2023-11-01 MED ORDER — TRIAMCINOLONE ACETONIDE 10 MG/ML IJ SUSP
10.0000 mg | Freq: Once | INTRAMUSCULAR | Status: AC
  Administered 2023-11-01: 10 mg

## 2023-11-01 NOTE — Patient Instructions (Signed)

## 2023-11-01 NOTE — Progress Notes (Signed)
 Patient complaints with pain at the heels bilaterally pain along the arch of the foot.  When he for stands up in the morning has extreme pain at the heels.  He works outside [employer].  Almost any kind of shoe he wears hurts.  Does recall any injury to the feet.  Has not noticed any redness or ecchymosis.   Physical exam:  General appearance: Pleasant, and in no acute distress. AOx3.  Vascular: Pedal pulses: DP 2/4 bilaterally, PT 2/4 bilaterally. Mild edema lower legs bilaterally. Capillary fill time immediate bilaterally.  Neurological: Light touch intact feet bilaterally.  Normal Achilles reflex bilaterally.  No clonus or spasticity noted.  No Tinel sign tarsal tunnel porta pedis bilaterally  Dermatologic:   Skin normal temperature bilaterally.  Skin normal color, tone, and texture bilaterally.   Musculoskeletal: Plantar medial aspect medial plantar calcaneal tubercle bilaterally tenderness along the length of the medial edge of the plantar fascial band bilaterally no fibromas noted.  No tenderness lateral compression of the calcaneus bilaterally    Diagnosis: 1.  Pain feet bilaterally 2.  Plantar fasciitis bilaterally  Plan: -New patient office visit for evaluation and management level 3.  Modifier 25. - Discussed plantar fasciitis and etiology and treatment of the pain in the feet bilaterally.  Recommend wearing good stable shoes avoid flat soled shoes or going in bare feet.  Will give instructions for exercises he can do at home. -Gave written at home therapy he can do. -RICE. -Dispensed OTC foot orthoses bilaterally -injected 3cc 2:1 mixture 0.5 cc Marcaine:Kenolog 10mg /71ml at medial plantar fascial origin at medial plantar calcaneal tubercle bilaterally.     Return 1 week follow-up plantar fasciitis and x-rays feet bilaterally

## 2023-11-06 DIAGNOSIS — M722 Plantar fascial fibromatosis: Secondary | ICD-10-CM

## 2023-11-09 ENCOUNTER — Ambulatory Visit (INDEPENDENT_AMBULATORY_CARE_PROVIDER_SITE_OTHER)

## 2023-11-09 ENCOUNTER — Other Ambulatory Visit (HOSPITAL_BASED_OUTPATIENT_CLINIC_OR_DEPARTMENT_OTHER): Payer: Self-pay

## 2023-11-09 ENCOUNTER — Encounter: Payer: Self-pay | Admitting: Podiatry

## 2023-11-09 ENCOUNTER — Ambulatory Visit: Admitting: Podiatry

## 2023-11-09 DIAGNOSIS — M722 Plantar fascial fibromatosis: Secondary | ICD-10-CM | POA: Diagnosis not present

## 2023-11-09 MED ORDER — PREDNISONE 5 MG PO TABS
ORAL_TABLET | ORAL | 1 refills | Status: AC
  Filled 2023-11-09: qty 42, 12d supply, fill #0

## 2023-11-09 NOTE — Progress Notes (Signed)
 Patient presents today complaining of pain feet bilaterally because of the injections helped for the first few days but then pain returns have some other aches and pains also.  Pain along the medial foot especially on the left along the arch and along the course of the posterior tibial tendon.  Tenderness on the right foot indicating anterior the sinus tarsi and subtalar joint.  Has not noticed any redness or ecchymosis in the area   Physical exam:  General appearance: Pleasant, and in no acute distress. AOx3.  Vascular: Pedal pulses: DP 2/4 bilaterally, PT 2/4 bilaterally.  Mild edema lower legs bilaterally. Capillary fill time immediate bilaterally.  Neurological: Light touch intact feet bilaterally.  Normal Achilles reflex bilaterally.  Negative Tinel sign tarsal tunnel and porta pedis and dorsal cutaneous nerves bilaterally  Dermatologic:   Skin normal temperature bilaterally.  Skin normal color, tone, and texture bilaterally.   Musculoskeletal: Tenderness at the plantar medial aspect of the heel at the medial calcaneal tubercle bilaterally.  Tenderness along the posterior tibial tendon from the medial malleolus to the navicular tuberosity on the left tenderness in the sinus tarsi and subtalar joint bilaterally.    Diagnosis: 1.  Plantar fasciitis bilaterally  Plan: -Established office visit for evaluation and management level 3. -Discussed the pain in the foot.  And he has multiple areas of tenderness today I will try a prednisone  12-day taper to see if this eliminates the pain or at least lessens it and leaves areas we can focus on.  Also try a night splint bilaterally.  Continue wearing good supportive shoes bilaterally. -Rx prednisone  5 mg, 30 mg p.o. daily, then decrease by 5 mg every other day. -  Return 2 weeks follow-up pain feet bilaterally

## 2023-11-29 ENCOUNTER — Ambulatory Visit: Admitting: Podiatry

## 2024-02-26 ENCOUNTER — Other Ambulatory Visit: Payer: Self-pay

## 2024-02-26 MED ORDER — MOUNJARO 2.5 MG/0.5ML ~~LOC~~ SOAJ
2.5000 mg | SUBCUTANEOUS | 1 refills | Status: AC
  Filled 2024-02-26 – 2024-03-06 (×6): qty 2, 28d supply, fill #0

## 2024-02-26 MED ORDER — VENLAFAXINE HCL ER 37.5 MG PO CP24
37.5000 mg | ORAL_CAPSULE | Freq: Every day | ORAL | 0 refills | Status: AC
  Filled 2024-02-26: qty 90, 90d supply, fill #0

## 2024-02-27 ENCOUNTER — Other Ambulatory Visit: Payer: Self-pay

## 2024-03-01 ENCOUNTER — Other Ambulatory Visit: Payer: Self-pay

## 2024-03-05 ENCOUNTER — Other Ambulatory Visit: Payer: Self-pay

## 2024-03-06 ENCOUNTER — Other Ambulatory Visit (HOSPITAL_COMMUNITY): Payer: Self-pay

## 2024-03-06 ENCOUNTER — Other Ambulatory Visit (HOSPITAL_BASED_OUTPATIENT_CLINIC_OR_DEPARTMENT_OTHER): Payer: Self-pay

## 2024-03-06 ENCOUNTER — Other Ambulatory Visit: Payer: Self-pay
